# Patient Record
Sex: Female | Born: 1960
Health system: Southern US, Community
[De-identification: ages and names within clinical notes are randomized; demographics above are authoritative.]

## PROBLEM LIST (undated history)

## (undated) DIAGNOSIS — R05 Cough: Secondary | ICD-10-CM

## (undated) DIAGNOSIS — I10 Essential (primary) hypertension: Secondary | ICD-10-CM

## (undated) DIAGNOSIS — E7800 Pure hypercholesterolemia, unspecified: Secondary | ICD-10-CM

## (undated) DIAGNOSIS — M7989 Other specified soft tissue disorders: Secondary | ICD-10-CM

## (undated) DIAGNOSIS — E039 Hypothyroidism, unspecified: Secondary | ICD-10-CM

## (undated) DIAGNOSIS — Z8709 Personal history of other diseases of the respiratory system: Secondary | ICD-10-CM

## (undated) DIAGNOSIS — E079 Disorder of thyroid, unspecified: Secondary | ICD-10-CM

## (undated) DIAGNOSIS — R059 Cough, unspecified: Secondary | ICD-10-CM

## (undated) DIAGNOSIS — M7060 Trochanteric bursitis, unspecified hip: Secondary | ICD-10-CM

## (undated) DIAGNOSIS — I8390 Asymptomatic varicose veins of unspecified lower extremity: Secondary | ICD-10-CM

## (undated) DIAGNOSIS — J302 Other seasonal allergic rhinitis: Secondary | ICD-10-CM

## (undated) HISTORY — PX: TONSILLECTOMY: SUR1361

## (undated) HISTORY — PX: KNEE SURGERY: SHX244

## (undated) HISTORY — DX: Trochanteric bursitis, unspecified hip: M70.60

## (undated) HISTORY — DX: Asymptomatic varicose veins of unspecified lower extremity: I83.90

## (undated) HISTORY — DX: Other specified soft tissue disorders: M79.89

---

## 1998-11-07 ENCOUNTER — Other Ambulatory Visit: Admission: RE | Admit: 1998-11-07 | Discharge: 1998-11-07 | Payer: Self-pay | Admitting: Obstetrics and Gynecology

## 2000-01-30 ENCOUNTER — Other Ambulatory Visit: Admission: RE | Admit: 2000-01-30 | Discharge: 2000-01-30 | Payer: Self-pay | Admitting: *Deleted

## 2000-11-27 ENCOUNTER — Encounter: Admission: RE | Admit: 2000-11-27 | Discharge: 2000-12-10 | Payer: Self-pay | Admitting: Sports Medicine

## 2000-12-11 ENCOUNTER — Encounter: Admission: RE | Admit: 2000-12-11 | Discharge: 2001-01-01 | Payer: Self-pay | Admitting: Sports Medicine

## 2000-12-18 ENCOUNTER — Emergency Department (HOSPITAL_COMMUNITY): Admission: EM | Admit: 2000-12-18 | Discharge: 2000-12-18 | Payer: Self-pay | Admitting: Emergency Medicine

## 2001-03-11 ENCOUNTER — Other Ambulatory Visit: Admission: RE | Admit: 2001-03-11 | Discharge: 2001-03-11 | Payer: Self-pay | Admitting: Obstetrics and Gynecology

## 2002-04-28 ENCOUNTER — Emergency Department (HOSPITAL_COMMUNITY): Admission: EM | Admit: 2002-04-28 | Discharge: 2002-04-28 | Payer: Self-pay | Admitting: Emergency Medicine

## 2002-05-17 ENCOUNTER — Other Ambulatory Visit: Admission: RE | Admit: 2002-05-17 | Discharge: 2002-05-17 | Payer: Self-pay | Admitting: Obstetrics and Gynecology

## 2003-06-09 ENCOUNTER — Other Ambulatory Visit: Admission: RE | Admit: 2003-06-09 | Discharge: 2003-06-09 | Payer: Self-pay | Admitting: Obstetrics and Gynecology

## 2004-06-12 ENCOUNTER — Ambulatory Visit: Payer: Self-pay | Admitting: Internal Medicine

## 2004-06-21 ENCOUNTER — Ambulatory Visit: Payer: Self-pay | Admitting: Internal Medicine

## 2004-06-25 ENCOUNTER — Ambulatory Visit (HOSPITAL_COMMUNITY): Admission: RE | Admit: 2004-06-25 | Discharge: 2004-06-25 | Payer: Self-pay | Admitting: Obstetrics and Gynecology

## 2004-06-29 ENCOUNTER — Ambulatory Visit: Payer: Self-pay | Admitting: Internal Medicine

## 2005-02-13 ENCOUNTER — Ambulatory Visit: Payer: Self-pay | Admitting: Internal Medicine

## 2005-02-26 ENCOUNTER — Ambulatory Visit (HOSPITAL_COMMUNITY): Admission: RE | Admit: 2005-02-26 | Discharge: 2005-02-26 | Payer: Self-pay | Admitting: Internal Medicine

## 2005-04-16 ENCOUNTER — Ambulatory Visit: Payer: Self-pay | Admitting: Pulmonary Disease

## 2006-09-09 ENCOUNTER — Ambulatory Visit (HOSPITAL_COMMUNITY): Admission: RE | Admit: 2006-09-09 | Discharge: 2006-09-09 | Payer: Self-pay | Admitting: Obstetrics and Gynecology

## 2008-02-10 ENCOUNTER — Ambulatory Visit: Payer: Self-pay | Admitting: Internal Medicine

## 2008-02-10 DIAGNOSIS — J329 Chronic sinusitis, unspecified: Secondary | ICD-10-CM | POA: Insufficient documentation

## 2008-02-10 DIAGNOSIS — T7840XA Allergy, unspecified, initial encounter: Secondary | ICD-10-CM | POA: Insufficient documentation

## 2008-02-10 DIAGNOSIS — G44219 Episodic tension-type headache, not intractable: Secondary | ICD-10-CM

## 2008-02-10 DIAGNOSIS — J310 Chronic rhinitis: Secondary | ICD-10-CM | POA: Insufficient documentation

## 2008-03-07 ENCOUNTER — Telehealth (INDEPENDENT_AMBULATORY_CARE_PROVIDER_SITE_OTHER): Payer: Self-pay | Admitting: *Deleted

## 2008-03-08 ENCOUNTER — Ambulatory Visit: Payer: Self-pay | Admitting: Internal Medicine

## 2008-07-30 ENCOUNTER — Emergency Department (HOSPITAL_BASED_OUTPATIENT_CLINIC_OR_DEPARTMENT_OTHER): Admission: EM | Admit: 2008-07-30 | Discharge: 2008-07-30 | Payer: Self-pay | Admitting: Emergency Medicine

## 2008-12-06 ENCOUNTER — Emergency Department (HOSPITAL_COMMUNITY): Admission: EM | Admit: 2008-12-06 | Discharge: 2008-12-06 | Payer: Self-pay | Admitting: Family Medicine

## 2009-04-19 ENCOUNTER — Telehealth: Payer: Self-pay | Admitting: Internal Medicine

## 2009-05-11 ENCOUNTER — Ambulatory Visit: Payer: Self-pay | Admitting: Internal Medicine

## 2009-05-17 ENCOUNTER — Telehealth (INDEPENDENT_AMBULATORY_CARE_PROVIDER_SITE_OTHER): Payer: Self-pay | Admitting: *Deleted

## 2010-06-07 ENCOUNTER — Encounter
Admission: RE | Admit: 2010-06-07 | Discharge: 2010-06-07 | Payer: Self-pay | Source: Home / Self Care | Attending: Obstetrics and Gynecology | Admitting: Obstetrics and Gynecology

## 2010-06-12 NOTE — Progress Notes (Signed)
Summary: Rhinocort RX  Phone Note Call from Patient Call back at [phone]   Caller: Patient Call For: wert Reason for Call: Talk to Nurse Summary of Call: pt told MW she wanted him to send her RX's to [facility].  He sent to [facility] on [address].  Can we c/x [facility] and re-send to [facility],? Initial call taken by: Eugene Gavia,  May 17, 2009 4:31 PM  Follow-up for Phone Call        [facility] to let pt know RX for both medications have been sent ot [facility] and [facility] was called and told to void RX's sent to them. Follow-up by: Michel Bickers CMA,  May 17, 2009 4:58 PM    Prescriptions: RHINOCORT AQUA 32 MCG/ACT SUSP (BUDESONIDE) 2 twice daily to taper to 1 at bedtime  #1 x 11   Entered by:   Michel Bickers CMA   Authorized by:   Nyoka Cowden MD   Signed by:   Michel Bickers CMA on 05/17/2009   Method used:   Electronically to        [facility]* (retail)       [address]       [address] Shipping/mailing       [place], [place]  [address]       Ph: [phone]       Fax: [phone]   RxID:   [id number] ESGIC-PLUS 50-500-40 MG CAPS [facility]) 1 every 6 hours if needed for headache  #40 x 0   Entered by:   Michel Bickers CMA   Authorized by:   Nyoka Cowden MD   Signed by:   Michel Bickers CMA on 05/17/2009   Method used:   Electronically to        [facility]* (retail)       [address]       [address] Shipping/mailing       [place], [place]  [address]       Ph: [phone]       Fax: [phone]   RxID:   [id number]

## 2010-08-08 ENCOUNTER — Other Ambulatory Visit: Payer: Self-pay | Admitting: Internal Medicine

## 2010-08-23 LAB — RAPID STREP SCREEN (MED CTR MEBANE ONLY): Streptococcus, Group A Screen (Direct): NEGATIVE

## 2010-08-30 ENCOUNTER — Encounter: Payer: Self-pay | Admitting: Family Medicine

## 2010-08-30 ENCOUNTER — Ambulatory Visit (INDEPENDENT_AMBULATORY_CARE_PROVIDER_SITE_OTHER): Payer: 59 | Admitting: Family Medicine

## 2010-08-30 VITALS — Ht 69.0 in | Wt 155.6 lb

## 2010-08-30 DIAGNOSIS — E785 Hyperlipidemia, unspecified: Secondary | ICD-10-CM

## 2010-08-30 NOTE — Patient Instructions (Addendum)
-   [place]phy Lecture:  Apr 25 at 8:30, Rooms 29, 30, & 31:  Hyper-palatable foods.  - Please let me know your most recent HDL, LDL, triglycerides, and total cholesterol.   - [employer] Lafonda Mosses, researcher at [employer].   - Eat at least 3 meals and 1-2 snacks daily.  Try to never go more than 5 hours between eating.   - Taste preferences are learned:  Preference follows practice.   - Make a list of (non-starchy) veg's and fruits you do like and eat; and a list of F & V you won't touch; and a list of F & V that you will consider if doctored up.  Work from the [employer]:  Try one F or V 3 X wk.  Use small amounts, cut small, and with seasoning.   - Goal:  Obtain at least 2 fruit or veg servings per day.  Track your intake daily.   - Physical activity:  There are 168 hours in a week.   Does it sound like enough to devote 2 1/2 hours a week to moving?   - Goal:  20 min TM walking 4 X wk (& continue 15 min floor exercises daily).   - AT YOUR NEXT VISIT, WE WILL DISCUSS MORE SPECIFIC DIETARY CHANGES FOR YOUR LIPID PROFILE.

## 2010-08-30 NOTE — Progress Notes (Signed)
Medical Nutrition Therapy:  Appt start time: 0900 end time:  1000.  Assessment:  Primary concerns today: hyperlipidemia.  Usual eating pattern includes bkfst and lunch meals and 2 snacks per day.  Everyday foods include flavored instant oatmeal, 20 oz diet Mtn Dew, and 1 c coffee w/ Splenda and 2-3 tbsp flavored creamer.  Avoided foods include alcohol, most veg's.  24-hr recall: B (7:30 AM)- 1 packet instant oatmeal, coffee w/ Splenda and 2-3 tbsp flavored creamer; L (12:30 PM)- [place] sandwich on thin bread, laughing cow cheese wedge, 3 baby carrots, diet Mtn Dew, piece of cake; Snk (3:30 PM)- diet Mtn Dew, fun size 3 Musketeers; D- none; Snk (8 PM)- 3 Snackwell cookies, water.   Usual physical activity includes 15 min floor exercises 7 X wk, and she parks far from work (~5 min) 4 X wk.  Anna has had hyperlipidemia for all of her adult life.  Tatia eats something sweet at least 2 X day.  She said she is aware that her diet is not very nutritious, but her taste preferences represent a major obstacle to change.    Progress Towards Goal(s):  In progress.   Nutritional Diagnosis:  NB-2.1 Physical inactivity As related to inadequate motivation.  As evidenced by current exercise limited to 15 min of floor exercises daily. . NI-5.8.3 Inappropriate intake of types of carbohydrates (specify): refined CHOs As related to sweets, fruit, and veg's.  As evidenced by rare consumption of fruit and veg's and frequent intake of sweets daily.    Intervention:  Nutrition education.  Monitoring/Evaluation:  Dietary intake, exercise, and body weight in 4 weeks.

## 2010-09-17 ENCOUNTER — Ambulatory Visit: Payer: 59 | Admitting: Family Medicine

## 2010-09-17 NOTE — Patient Instructions (Addendum)
-   Reminder:  Eat at least 3 meals and 1-2 snacks per day.  Aim for no more than 5 hours between eating.  This way of eating helps to control cholesterol levels, blood sugar, and weight.   - Continue to increase veg's, this time working from your "OK" list.  Use ideas from "Deceptively Delicious" to add veg's to recipes, and work toward increasing the amount of veg's used so they are detectable.   - Goals for exercise this week:    1. 20 min 4 X wk (plus floor exercises at least 6 X wk).    2. Each work day,l take stairs at least once a day to third floor.   - Again:  Look for one of [employer] to hear.   - Lipids management:  Review handout provided on heart-healthy diet.

## 2010-09-17 NOTE — Progress Notes (Signed)
Medical Nutrition Therapy:  Appt start time: 0900 end time:  1000.  Assessment:  Primary concerns today: hyperlipidemia.  Daelyn has not been able to increase exercise mostly b/c she has been out of town for 2 1/2 weeks.   During that time, she was eating out for most meals.  Since home, she has not gotten back into her usual exercise routine, but she has been eating more veg's.  Although Marva made a list of veg's she likes, dislikes, and might be willing to try, she has not yet incorporated any of the latter to her diet.  Amberlie has cut down on her Splenda and creamer in her coffee (3 instead of 4 tsp of each).  Trenesha continues to eat something sweet on most days, although portion size is small, i.e., candies.    Progress Towards Goal(s):  In progress.   Nutritional Diagnosis:  NB-2.1 Physical inactivity As related to inadequate motivation.  As evidenced by current exercise limited to 15 min of floor exercises a few times a week. NI-5.8.3 Inappropriate intake of types of carbohydrates (specify): refined CHOs As related to sweets, fruit, and vegetables.  As evidenced by rare consumption of fruit and veg&amp;#39;s and intake of sweets daily.    Intervention:  Nutrition education.  Monitoring/Evaluation:  Dietary intake, exercise, and body weight in 4 weeks.

## 2010-10-02 ENCOUNTER — Encounter: Payer: Self-pay | Admitting: Family Medicine

## 2010-10-15 ENCOUNTER — Ambulatory Visit (INDEPENDENT_AMBULATORY_CARE_PROVIDER_SITE_OTHER): Payer: 59 | Admitting: Family Medicine

## 2010-10-15 DIAGNOSIS — E785 Hyperlipidemia, unspecified: Secondary | ICD-10-CM

## 2010-10-15 NOTE — Progress Notes (Signed)
Medical Nutrition Therapy:  Appt start time: 0900 end time:  1000.  Assessment:  Primary concerns today: hyperlipidemia.  Among her new foods she has tried are lima beans, blackeyed peas, and butter beans.  She is still eating carrots frequently, but has not tried any non-starchy veg's.  She has started eating apples some, and is considering cantaloupe and possibly broccoli.  24-hr recall: B (8 AM)- Quaker cinn-swirl high-fiber instant oatmeal, 1 c coffee w/ 3 tsp Carnation Jamaica Vanilla creamer and 3 Splenda; L (1 PM)- 2 Kellogg's cereal bars, 6 oz Mtn Dew; Snk (2 PM)- 2 pb crackers w/ Captain's wafers; D (5 PM)- small piece Marie Calender's lasagna, garlic bread, water.  Yesterday was atypical in that she did not have a real lunch.  Lesa is taking the stairs at work regularly, but is only walking 2-3 X wk instead of 4.  She still does her floor exercises daily, and has added small weights to her routine.    Progress Towards Goal(s):  In progress.   Nutritional Diagnosis:  NB-2.1 Physical inactivity As related to inadequate motivation.  As evidenced by current exercise limited to nearly daily floor exercises and ~20 min walking 2-3 X wk. NI-5.8.2 Excessive carbohydrate intake As related to sweets, fruit, and vegetables.  As evidenced by rare consumption of veg's (although fruit intake has increased to ~daily.    Intervention:  Nutrition education.  Monitoring/Evaluation:  Dietary intake, exercise, and body weight in 4 weeks.

## 2010-10-15 NOTE — Patient Instructions (Addendum)
-   Continue to try some new foods, especially VEGETABLES.  For convenience, use your microwave for veg's.  Add no water, but do cover loosely, and microwave on high (on average for 2 min per serving).  - In trying to incorporate new veg's, use small amounts, cut into small pieces.    - I encourage you to go back to trying to cut down on the amount of cream and sweetener in your coffee, going down to 2 1/2 of each rather than jumping to 2.  Or consider skim milk instead of creamer (and use all you want).   - Obtain twice as many veg's as protein or carbohydrate foods for both lunch and dinner. - Make a list of at least 7 meals that you like, that meet your nutritional needs, AND are relatively easy to prepare.  Keep this list in a place where it's handy.  Email this list to [facility] for review.   - Please call or email for next month's appt.

## 2011-06-13 ENCOUNTER — Other Ambulatory Visit: Payer: Self-pay | Admitting: Obstetrics and Gynecology

## 2011-06-13 DIAGNOSIS — Z1231 Encounter for screening mammogram for malignant neoplasm of breast: Secondary | ICD-10-CM

## 2011-06-24 ENCOUNTER — Ambulatory Visit
Admission: RE | Admit: 2011-06-24 | Discharge: 2011-06-24 | Disposition: A | Discharge status: Home / Self Care | Payer: 59 | Source: Ambulatory Visit | Attending: Obstetrics and Gynecology | Admitting: Obstetrics and Gynecology

## 2011-06-24 DIAGNOSIS — Z1231 Encounter for screening mammogram for malignant neoplasm of breast: Secondary | ICD-10-CM

## 2012-05-26 ENCOUNTER — Ambulatory Visit: Payer: 59 | Admitting: Physical Therapy

## 2012-05-28 ENCOUNTER — Other Ambulatory Visit (HOSPITAL_COMMUNITY): Payer: Self-pay | Admitting: Sports Medicine

## 2012-05-28 DIAGNOSIS — M25512 Pain in left shoulder: Secondary | ICD-10-CM

## 2012-05-29 ENCOUNTER — Ambulatory Visit (HOSPITAL_COMMUNITY)
Admission: RE | Admit: 2012-05-29 | Discharge: 2012-05-29 | Disposition: A | Discharge status: Home / Self Care | Payer: 59 | Source: Ambulatory Visit | Attending: Sports Medicine | Admitting: Sports Medicine

## 2012-05-29 DIAGNOSIS — M67919 Unspecified disorder of synovium and tendon, unspecified shoulder: Secondary | ICD-10-CM | POA: Insufficient documentation

## 2012-05-29 DIAGNOSIS — M25512 Pain in left shoulder: Secondary | ICD-10-CM

## 2012-05-29 DIAGNOSIS — M719 Bursopathy, unspecified: Secondary | ICD-10-CM | POA: Insufficient documentation

## 2012-06-27 ENCOUNTER — Other Ambulatory Visit: Payer: Self-pay

## 2013-03-18 ENCOUNTER — Other Ambulatory Visit: Payer: Self-pay

## 2013-05-03 ENCOUNTER — Emergency Department (HOSPITAL_COMMUNITY)
Admission: EM | Admit: 2013-05-03 | Discharge: 2013-05-03 | Disposition: A | Discharge status: Home / Self Care | Payer: 59 | Source: Home / Self Care | Attending: Emergency Medicine | Admitting: Emergency Medicine

## 2013-05-03 ENCOUNTER — Encounter (HOSPITAL_COMMUNITY): Payer: Self-pay | Admitting: Emergency Medicine

## 2013-05-03 DIAGNOSIS — R05 Cough: Secondary | ICD-10-CM

## 2013-05-03 HISTORY — DX: Disorder of thyroid, unspecified: E07.9

## 2013-05-03 HISTORY — DX: Essential (primary) hypertension: I10

## 2013-05-03 HISTORY — DX: Pure hypercholesterolemia, unspecified: E78.00

## 2013-05-03 MED ORDER — HYDROCOD POLST-CHLORPHEN POLST 10-8 MG/5ML PO LQCR: 5.0000 mL | Freq: Two times a day (BID) | ORAL | Status: DC | PRN

## 2013-05-03 MED ORDER — AZITHROMYCIN 250 MG PO TABS: ORAL_TABLET | ORAL | Status: DC

## 2013-05-03 NOTE — ED Provider Notes (Signed)
Medical screening examination/treatment/procedure(s) were performed by non-physician practitioner and as supervising physician I was immediately available for consultation/collaboration.  Leslee Home, M.D.   Reuben Likes, MD 05/03/13 2229

## 2013-05-03 NOTE — ED Provider Notes (Signed)
CSN: [id number]     Arrival date & time 05/03/13  0801 History   First MD Initiated Contact with Patient 05/03/13 0827     Chief Complaint  Patient presents with  . Cough   (Consider location/radiation/quality/duration/timing/severity/associated sxs/prior Treatment) HPI Comments: 52 year old female presents complaining of cough for 6 weeks, now developing some nasal congestion, sneezing, rhinorrhea. His cough initially began at the beginning of November. She was treated with Xanax which did help. However, the cough returned when the medication was finished. She was treated again with Levocetirizine, Tessalon Perles, and Delsym. This does not seem to be helping with the cough. The nasal congestion, sneezing, and rhinorrhea began 3 days ago. She is not having any shortness of breath, chest pain, pleuritic pain, leg swelling. No history of DVT or PE. She had a chest x-ray on December 10 which was normal. She also admits to a sensation of stuffiness or fullness in the ears bilaterally. She does not take an ACE inhibitor. No recent history of travel out of the country in the last 6 months. She does not smoke and has never smoked. No heartburn  Patient is a 52 y.o. female presenting with cough.  Cough Associated symptoms: ear pain, rhinorrhea and sore throat   Associated symptoms: no chest pain, no chills, no fever, no myalgias, no rash and no shortness of breath     Past Medical History  Diagnosis Date  . Hypertension   . Thyroid disease   . High cholesterol    History reviewed. No pertinent past surgical history. No family history on file. History  Substance Use Topics  . Smoking status: Passive Smoke Exposure - Never Smoker  . Smokeless tobacco: Not on file  . Alcohol Use: No   OB History   Grav Para Term Preterm Abortions TAB SAB Ect Mult Living                 Review of Systems  Constitutional: Negative for fever and chills.  HENT: Positive for congestion, ear pain, rhinorrhea,  sneezing and sore throat.   Eyes: Negative for visual disturbance.  Respiratory: Positive for cough. Negative for shortness of breath.   Cardiovascular: Negative for chest pain, palpitations and leg swelling.  Gastrointestinal: Negative for nausea, vomiting and abdominal pain.  Endocrine: Negative for polydipsia and polyuria.  Genitourinary: Negative for dysuria, urgency and frequency.  Musculoskeletal: Negative for arthralgias and myalgias.  Skin: Negative for rash.  Neurological: Negative for dizziness, weakness and light-headedness.    Allergies  Review of patient's allergies indicates no known allergies.  Home Medications   Current Outpatient Rx  Name  Route  Sig  Dispense  Refill  . BENZONATATE PO   Oral   Take by mouth.         . dextromethorphan (DELSYM) 30 MG/5ML liquid   Oral   Take by mouth as needed for cough.         . Hydrocod Polst-Chlorphen Polst (TUSSIONEX PENNKINETIC ER PO)   Oral   Take by mouth.         Marland Kitchen azithromycin (ZITHROMAX Z-PAK) 250 MG tablet      Use as directed   6 each   1   . chlorpheniramine-HYDROcodone (TUSSIONEX PENNKINETIC ER) 10-8 MG/5ML LQCR   Oral   Take 5 mLs by mouth every 12 (twelve) hours as needed for cough.   115 mL   0   . Coenzyme Q10 (COQ10) 200 MG CAPS   Oral   Take 200 mg  by mouth 1 dose over 46 hours.           . hydrochlorothiazide (,MICROZIDE/HYDRODIURIL,) 12.5 MG capsule   Oral   Take 12.5 mg by mouth daily.           Marland Kitchen levothyroxine (LEVOXYL) 75 MCG tablet   Oral   Take 75 mcg by mouth daily.           . Multiple Vitamins-Minerals (MULTIVITAMIN WITH MINERALS) tablet   Oral   Take 1 tablet by mouth daily.           . norethindrone-ethinyl estradiol-iron (MICROGESTIN FE1.5/30) 1.5-30 MG-MCG tablet   Oral   Take 1 tablet by mouth daily.           . rosuvastatin (CRESTOR) 20 MG tablet   Oral   Take 20 mg by mouth daily.            BP 154/89  Pulse 84  Temp(Src) 98.6 F (37 C)  (Oral)  Resp 16  SpO2 100% Physical Exam  Nursing note and vitals reviewed. Constitutional: She is oriented to person, place, and time. Vital signs are normal. She appears well-developed and well-nourished. No distress.  HENT:  Head: Normocephalic and atraumatic.  Right Ear: External ear normal.  Left Ear: External ear normal.  Nose: Nose normal.  Mouth/Throat: Oropharynx is clear and moist. No oropharyngeal exudate.  Neck: Normal range of motion. Neck supple. No JVD present. No tracheal deviation present.  Cardiovascular: Normal rate, regular rhythm and normal heart sounds.  Exam reveals no gallop and no friction rub.   No murmur heard. Pulmonary/Chest: Effort normal and breath sounds normal. No respiratory distress. She has no wheezes. She has no rales.  Lymphadenopathy:    She has no cervical adenopathy.  Neurological: She is alert and oriented to person, place, and time. She has normal strength. Coordination normal.  Skin: Skin is warm and dry. No rash noted. She is not diaphoretic.  Psychiatric: She has a normal mood and affect. Judgment normal.    ED Course  Procedures (including critical care time) Labs Review Labs Reviewed - No data to display Imaging Review No results found.    MDM   1. Cough     I suspect atypical infection such as mycoplasma or pertussis. Treating with azithromycin, also Tussionex for cough. Follow up with primary care if not improving. Discussed other causes of cough, suggest CT scan if cough persists   New Prescriptions   AZITHROMYCIN (ZITHROMAX Z-PAK) 250 MG TABLET    Use as directed   CHLORPHENIRAMINE-HYDROCODONE (TUSSIONEX PENNKINETIC ER) 10-8 MG/5ML LQCR    Take 5 mLs by mouth every 12 (twelve) hours as needed for cough.     Graylon Good, PA-C 05/03/13 [phone]

## 2013-05-03 NOTE — ED Notes (Signed)
Cough that started approx 6 weeks ago.  Cough improved, and since Thanksgiving and traveling, cough has worsened.  Over the weekend developed allergy symptoms: runny nose, sneeze, sore throat, stuffy ears.

## 2014-01-25 ENCOUNTER — Other Ambulatory Visit: Payer: Self-pay

## 2014-01-25 DIAGNOSIS — Z1231 Encounter for screening mammogram for malignant neoplasm of breast: Secondary | ICD-10-CM

## 2014-02-17 ENCOUNTER — Ambulatory Visit: Payer: 59

## 2014-03-15 ENCOUNTER — Ambulatory Visit: Payer: 59

## 2014-03-30 ENCOUNTER — Ambulatory Visit
Admission: RE | Admit: 2014-03-30 | Discharge: 2014-03-30 | Disposition: A | Discharge status: Home / Self Care | Payer: 59 | Source: Ambulatory Visit

## 2014-03-30 DIAGNOSIS — Z1231 Encounter for screening mammogram for malignant neoplasm of breast: Secondary | ICD-10-CM

## 2015-01-23 ENCOUNTER — Encounter: Payer: Self-pay | Admitting: Family Medicine

## 2015-01-23 ENCOUNTER — Ambulatory Visit (INDEPENDENT_AMBULATORY_CARE_PROVIDER_SITE_OTHER): Payer: 59 | Admitting: Family Medicine

## 2015-01-23 VITALS — BP 124/72 | HR 71 | Ht 68.0 in | Wt 142.0 lb

## 2015-01-23 DIAGNOSIS — M542 Cervicalgia: Secondary | ICD-10-CM

## 2015-01-23 MED ORDER — CYCLOBENZAPRINE HCL 5 MG PO TABS: ORAL_TABLET | ORAL | Status: DC

## 2015-01-23 NOTE — Progress Notes (Signed)
Patient ID: Ashley Burke, female   DOB: 03-10-1961, 54 y.o.   MRN: [id number]  Ashley Burke - 54 y.o. female MRN [id number]  Date of birth: February 04, 1961    SUBJECTIVE:     Left-sided neck pain for little over a week. Acute onset. Awoke to find her neck stiff extending down the left upper and mid back. Was seen orthopedist office where they did x-rays which were reportedly negative. They recommended massage therapy. She has continued to improve but still having symptoms. Overall she's about 30-40% better.  Pain and stiffness mostly left and posterior part of her neck into the tuft upper shoulder and mid back. No headache. ROS:     She's had no unusual weight change, no fever, sweats, chills. No unusual numbness or tingling, no upper extremity weakness.   PERTINENT  PMH / PSH FH / / SH:  Past Medical, Surgical, Social, and Family History Reviewed & Updated in the EMR.  Pertinent findings include:  Can think of no specific identifiable injury or activity that might have predisposed her for this. She has no personal history of diabetes mellitus or cancer, no history of osteoporosis. No history of pathologic fracture.  OBJECTIVE: BP 124/72 mmHg  Pulse 71  Ht 5\' 8"  (1.727 m)  Wt 142 lb (64.411 kg)  BMI 21.60 kg/m2  LMP 05/22/2012  Physical Exam:  Vital signs are reviewed. GEN.: Well-developed female no acute distress NECK: She has full range of motion but expresses a lot of pain in pulling sensation with forward flexion and some with extension. Lateral rotation to the left is limited by pain but full in range of motion, lateral rotation to the right side is essentially full. Very mild tenderness to palpation along the left splenius capitis and uppermost left trapezius muscles. Cervical vertebra are nontender to percussion. Negative Spurling's. MSK: Upper extremity strength is 5 out of 5 in all planes the rotator cuff. She has intact sensation soft touch upper extremity.  ASSESSMENT &  PLAN:  See problem based charting & AVS for pt instructions.

## 2015-01-23 NOTE — Patient Instructions (Signed)
[email or web address]  I will call in some mild muscle relaxers Great to see you!

## 2015-01-23 NOTE — Assessment & Plan Note (Signed)
Gave her some overhead press exercise instructions and some neck stretches. We will do a short course of low-dose muscle relaxers at night. She is improving at this time. If she does not continue to improve and ultimately resolve or her symptoms returned, would want her to give [place] a call. At that point I would want to either see the images done by the orthopedist office or get some new imaging studies. We discussed red flags such as weakness or numbness in the extremity.

## 2015-03-31 ENCOUNTER — Other Ambulatory Visit (HOSPITAL_BASED_OUTPATIENT_CLINIC_OR_DEPARTMENT_OTHER): Payer: 59

## 2015-03-31 ENCOUNTER — Other Ambulatory Visit (HOSPITAL_BASED_OUTPATIENT_CLINIC_OR_DEPARTMENT_OTHER): Payer: Self-pay | Admitting: Family Medicine

## 2015-03-31 ENCOUNTER — Other Ambulatory Visit (HOSPITAL_COMMUNITY): Payer: Self-pay | Admitting: Family Medicine

## 2015-03-31 DIAGNOSIS — M79662 Pain in left lower leg: Secondary | ICD-10-CM

## 2015-03-31 DIAGNOSIS — M7989 Other specified soft tissue disorders: Principal | ICD-10-CM

## 2015-03-31 DIAGNOSIS — R609 Edema, unspecified: Secondary | ICD-10-CM

## 2015-04-03 ENCOUNTER — Encounter (HOSPITAL_COMMUNITY): Payer: 59

## 2015-04-04 ENCOUNTER — Ambulatory Visit (HOSPITAL_COMMUNITY)
Admission: RE | Admit: 2015-04-04 | Discharge: 2015-04-04 | Disposition: A | Discharge status: Home / Self Care | Payer: 59 | Source: Ambulatory Visit | Attending: Family Medicine | Admitting: Family Medicine

## 2015-04-04 DIAGNOSIS — R609 Edema, unspecified: Secondary | ICD-10-CM

## 2015-04-10 ENCOUNTER — Ambulatory Visit (HOSPITAL_COMMUNITY)
Admission: RE | Admit: 2015-04-10 | Discharge: 2015-04-10 | Disposition: A | Discharge status: Home / Self Care | Payer: 59 | Source: Ambulatory Visit | Attending: Family Medicine | Admitting: Family Medicine

## 2015-04-10 DIAGNOSIS — M7989 Other specified soft tissue disorders: Secondary | ICD-10-CM | POA: Diagnosis present

## 2015-04-10 DIAGNOSIS — I8392 Asymptomatic varicose veins of left lower extremity: Secondary | ICD-10-CM | POA: Insufficient documentation

## 2015-04-10 DIAGNOSIS — R609 Edema, unspecified: Secondary | ICD-10-CM | POA: Diagnosis not present

## 2015-04-10 NOTE — Progress Notes (Signed)
VASCULAR LAB PRELIMINARY  PRELIMINARY  PRELIMINARY  PRELIMINARY  Left lower extremity venous duplex completed.    Preliminary report:  Left:  No evidence of DVT, superficial thrombosis, or Baker's cyst.  Warnie Belair, RVT 04/10/2015, 8:28 AM

## 2015-04-28 ENCOUNTER — Other Ambulatory Visit: Payer: Self-pay | Admitting: *Deleted

## 2015-04-28 ENCOUNTER — Encounter: Payer: Self-pay | Admitting: Vascular Surgery

## 2015-04-28 DIAGNOSIS — I83893 Varicose veins of bilateral lower extremities with other complications: Secondary | ICD-10-CM

## 2015-05-23 DIAGNOSIS — I8312 Varicose veins of left lower extremity with inflammation: Secondary | ICD-10-CM | POA: Diagnosis not present

## 2015-05-23 DIAGNOSIS — I8311 Varicose veins of right lower extremity with inflammation: Secondary | ICD-10-CM | POA: Diagnosis not present

## 2015-05-30 DIAGNOSIS — I8312 Varicose veins of left lower extremity with inflammation: Secondary | ICD-10-CM | POA: Diagnosis not present

## 2015-05-30 DIAGNOSIS — I83813 Varicose veins of bilateral lower extremities with pain: Secondary | ICD-10-CM | POA: Diagnosis not present

## 2015-05-30 DIAGNOSIS — I8311 Varicose veins of right lower extremity with inflammation: Secondary | ICD-10-CM | POA: Diagnosis not present

## 2015-06-05 MED FILL — YUVAFEM 10 MCG VAGINAL INSE: 10 | 30 days supply | Qty: 18 | Fill #1

## 2015-06-05 MED FILL — MELOXICAM 15 MG TABLET: 15 | 30 days supply | Qty: 30 | Fill #1

## 2015-06-05 MED FILL — ROSUVASTATIN CALCIUM 20 MG: 20 | 90 days supply | Qty: 90 | Fill #2

## 2015-06-05 MED FILL — LEVOTHYROXINE 50 MCG TABLET: 50 | 90 days supply | Qty: 90 | Fill #0

## 2015-06-06 MED FILL — FLUTICASONE PROP 50 MCG SPR: 50 | 30 days supply | Qty: 16 | Fill #0

## 2015-06-14 ENCOUNTER — Encounter: Payer: Self-pay | Admitting: Vascular Surgery

## 2015-06-14 DIAGNOSIS — E039 Hypothyroidism, unspecified: Secondary | ICD-10-CM | POA: Diagnosis not present

## 2015-06-14 DIAGNOSIS — E782 Mixed hyperlipidemia: Secondary | ICD-10-CM | POA: Diagnosis not present

## 2015-06-20 ENCOUNTER — Ambulatory Visit (INDEPENDENT_AMBULATORY_CARE_PROVIDER_SITE_OTHER): Payer: 59 | Admitting: Vascular Surgery

## 2015-06-20 ENCOUNTER — Encounter: Payer: Self-pay | Admitting: Vascular Surgery

## 2015-06-20 ENCOUNTER — Ambulatory Visit (HOSPITAL_COMMUNITY)
Admission: RE | Admit: 2015-06-20 | Discharge: 2015-06-20 | Disposition: A | Discharge status: Home / Self Care | Payer: 59 | Source: Ambulatory Visit | Attending: Vascular Surgery | Admitting: Vascular Surgery

## 2015-06-20 VITALS — BP 132/85 | HR 76 | Temp 98.5°F | Resp 14 | Ht 69.0 in | Wt 148.0 lb

## 2015-06-20 DIAGNOSIS — I83892 Varicose veins of left lower extremities with other complications: Secondary | ICD-10-CM | POA: Diagnosis not present

## 2015-06-20 DIAGNOSIS — I83893 Varicose veins of bilateral lower extremities with other complications: Secondary | ICD-10-CM

## 2015-06-20 DIAGNOSIS — I10 Essential (primary) hypertension: Secondary | ICD-10-CM | POA: Insufficient documentation

## 2015-06-20 DIAGNOSIS — E7800 Pure hypercholesterolemia, unspecified: Secondary | ICD-10-CM | POA: Diagnosis not present

## 2015-06-20 NOTE — Progress Notes (Signed)
Subjective:     Patient ID: Ashley Burke, female   DOB: 1960-10-20, 55 y.o.   MRN: [id number]  HPI This 55 year old female was evaluated today for painful varicosities in the left leg. She began having swelling in the left lower leg in November 2016. She has no history of DVT thrombophlebitis stasis ulcers or bleeding. She noticed prominent bulges in her left posterior calf which have worsened over the past few months. She has aching burning and throbbing discomfort as the day progresses. She was seen at [place] [facility] in late December and it was recommended that she have laser ablation of her left saphenous vein. She has been wearing long leg elastic compression stockings 20-30 mm gradient since late December 2016. She has also tried pain medication and elevation which she is unable to do at work.   Past Medical History  Diagnosis Date  . Hypertension   . Thyroid disease   . High cholesterol   . Varicose veins   . Leg swelling   . Trochanteric bursitis     Social History  Substance Use Topics  . Smoking status: Never Smoker   . Smokeless tobacco: Not on file  . Alcohol Use: No    Family History  Problem Relation Age of Onset  . Cancer Mother     liver  . Cancer Father     lung  . Heart disease Father     Allergies  Allergen Reactions  . Amlodipine Besylate Other (See Comments)    dizziness     Current outpatient prescriptions:  .  budesonide (RHINOCORT AQUA) 32 MCG/ACT nasal spray, Place 1 spray into both nostrils as needed for rhinitis., Disp: , Rfl:  .  Coenzyme Q10 (COQ10) 200 MG CAPS, Take 200 mg by mouth 1 dose over 46 hours.  , Disp: , Rfl:  .  hydrochlorothiazide (,MICROZIDE/HYDRODIURIL,) 12.5 MG capsule, Take 12.5 mg by mouth daily.  , Disp: , Rfl:  .  Multiple Vitamins-Minerals (MULTIVITAMIN WITH MINERALS) tablet, Take 1 tablet by mouth daily.  , Disp: , Rfl:  .  norethindrone-ethinyl estradiol-iron (MICROGESTIN FE1.5/30) 1.5-30 MG-MCG tablet, Take 1 tablet by  mouth daily.  , Disp: , Rfl:  .  rosuvastatin (CRESTOR) 20 MG tablet, Take 20 mg by mouth daily.  , Disp: , Rfl:  .  vitamin B-12 (CYANOCOBALAMIN) 1000 MCG tablet, Take 1,000 mcg by mouth daily., Disp: , Rfl:  .  azithromycin (ZITHROMAX Z-PAK) 250 MG tablet, Use as directed (Patient not taking: Reported on 01/23/2015), Disp: 6 each, Rfl: 1 .  BENZONATATE PO, Take by mouth. Reported on 06/20/2015, Disp: , Rfl:  .  chlorpheniramine-HYDROcodone (TUSSIONEX PENNKINETIC ER) 10-8 MG/5ML LQCR, Take 5 mLs by mouth every 12 (twelve) hours as needed for cough. (Patient not taking: Reported on 01/23/2015), Disp: 115 mL, Rfl: 0 .  cyclobenzaprine (FLEXERIL) 5 MG tablet, Take one half or one tab at bedtime prn (Patient not taking: Reported on 06/20/2015), Disp: 30 tablet, Rfl: 0 .  dextromethorphan (DELSYM) 30 MG/5ML liquid, Take by mouth as needed for cough. Reported on 06/20/2015, Disp: , Rfl:  .  levothyroxine (LEVOXYL) 75 MCG tablet, Take 50 mcg by mouth daily. Reported on 06/20/2015, Disp: , Rfl:   Filed Vitals:   06/20/15 1013  BP: 132/85  Pulse: 76  Temp: 98.5 F (36.9 C)  Resp: 14  Height: 5\' 9"  (1.753 m)  Weight: 148 lb (67.132 kg)  SpO2: 98%    Body mass index is 21.85 kg/(m^2).  Review of Systems  Denies chest pain, dyspneaon exertion, PND, orthopnea, claudication. All systems negative for complete review of systems. Did have previous fracture of her left knee treated by Dr. Noemi Chapel     Objective:   Physical Exam BP 132/85 mmHg  Pulse 76  Temp(Src) 98.5 F (36.9 C)  Resp 14  Ht 5\' 9"  (1.753 m)  Wt 148 lb (67.132 kg)  BMI 21.85 kg/m2  SpO2 98%  LMP 05/22/2012  Gen.-alert and oriented x3 in no apparent distress HEENT normal for age Lungs no rhonchi or wheezing Cardiovascular regular rhythm no murmurs carotid pulses 3+ palpable no bruits audible Abdomen soft nontender no palpable masses Musculoskeletal free of  major deformities Skin clear -no rashes Neurologic  normal Lower extremities 3+ femoral and dorsalis pedis pulses palpable bilaterally with  1+ edema left ankle  Bulging varicosities in left posterior calf and reticular and spider veins around left malleolar area. No hyperpigmentation or ulceration noted.  Today I ordered a venous duplex exam which I reviewed and interpreted. There is no DVT. There is gross reflux throughout a large left great saphenous vein from the mid calf to near the saphenofemoral junction. Left small saphenous vein has reflux distally but it is small caliber.       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 This is causing symptoms which are affecting patient's daily living and ability to work including pain and swelling     Plan:         #1 long leg elastic compression stockings 20-30 mm gradient #2 elevate legs as much as possible #3 ibuprofen daily on a regular basis for pain #4 return in 3 months-if no significant improvement then  She will need number-one laser ablation left great saphenous vein plus 10-20 stab phlebectomy of painful varicosities followed by 1 session of sclerotherapy  She'll return  Late March which will be 3 months following her bginning the elastic compression stockings in late December

## 2015-07-03 MED FILL — VAGIFEM 10 MCG VAGINAL TAB: 10 | 28 days supply | Qty: 8 | Fill #2

## 2015-07-18 DIAGNOSIS — M7541 Impingement syndrome of right shoulder: Secondary | ICD-10-CM | POA: Diagnosis not present

## 2015-07-18 DIAGNOSIS — M542 Cervicalgia: Secondary | ICD-10-CM | POA: Diagnosis not present

## 2015-07-26 ENCOUNTER — Encounter: Payer: Self-pay | Admitting: Vascular Surgery

## 2015-07-31 ENCOUNTER — Ambulatory Visit (INDEPENDENT_AMBULATORY_CARE_PROVIDER_SITE_OTHER): Payer: 59 | Admitting: Vascular Surgery

## 2015-07-31 ENCOUNTER — Other Ambulatory Visit: Payer: Self-pay | Admitting: Family Medicine

## 2015-07-31 VITALS — BP 118/68 | HR 63 | Temp 97.7°F | Resp 14 | Ht 68.5 in | Wt 148.0 lb

## 2015-07-31 DIAGNOSIS — I83892 Varicose veins of left lower extremities with other complications: Secondary | ICD-10-CM | POA: Diagnosis not present

## 2015-07-31 MED FILL — MELOXICAM 15 MG TABLET: 15 | 30 days supply | Qty: 30 | Fill #2

## 2015-07-31 MED FILL — CYCLOBENZAPRINE 5 MG TABLET: 5 | 30 days supply | Qty: 30 | Fill #0

## 2015-07-31 NOTE — Progress Notes (Signed)
Subjective:     Patient ID: Ashley Burke, female   DOB: 02-19-1961, 55 y.o.   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 She has been wearing her long leg elastic compression stockings for 3 months 20-30 millimeter gradient since she was evaluated at [facility] in December 2016. She has had no improvement in her symptomatology. She continues to have aching throbbing burning discomfort in the left leg which worsens as the day progresses and is affecting her daily living.  Past Medical History  Diagnosis Date  . Hypertension   . Thyroid disease   . High cholesterol   . Varicose veins   . Leg swelling   . Trochanteric bursitis     Social History  Substance Use Topics  . Smoking status: Never Smoker   . Smokeless tobacco: Not on file  . Alcohol Use: No    Family History  Problem Relation Age of Onset  . Cancer Mother     liver  . Cancer Father     lung  . Heart disease Father     Allergies  Allergen Reactions  . Amlodipine Besylate Other (See Comments)    dizziness     Current outpatient prescriptions:  .  budesonide (RHINOCORT AQUA) 32 MCG/ACT nasal spray, Place 1 spray into both nostrils as needed for rhinitis., Disp: , Rfl:  .  Coenzyme Q10 (COQ10) 200 MG CAPS, Take 200 mg by mouth 1 dose over 46 hours.  , Disp: , Rfl:  .  levothyroxine (LEVOXYL) 75 MCG tablet, Take 50 mcg by mouth daily. Reported on 06/20/2015, Disp: , Rfl:  .  Multiple Vitamins-Minerals (MULTIVITAMIN WITH MINERALS) tablet, Take 1 tablet by mouth daily.  , Disp: , Rfl:  .  rosuvastatin (CRESTOR) 20 MG tablet, Take 20 mg by mouth daily.  , Disp: , Rfl:  .  vitamin B-12 (CYANOCOBALAMIN) 1000 MCG tablet, Take 1,000 mcg by mouth daily., Disp: , Rfl:  .  azithromycin (ZITHROMAX Z-PAK) 250 MG tablet, Use as directed (Patient not taking: Reported on 01/23/2015),  Disp: 6 each, Rfl: 1 .  BENZONATATE PO, Take by mouth. Reported on 07/31/2015, Disp: , Rfl:  .  chlorpheniramine-HYDROcodone (TUSSIONEX PENNKINETIC ER) 10-8 MG/5ML LQCR, Take 5 mLs by mouth every 12 (twelve) hours as needed for cough. (Patient not taking: Reported on 01/23/2015), Disp: 115 mL, Rfl: 0 .  cyclobenzaprine (FLEXERIL) 5 MG tablet, TAKE 1/2- 1 TABLET AT BEDTIME AS NEEDED (Patient not taking: Reported on 07/31/2015), Disp: 30 tablet, Rfl: 0 .  dextromethorphan (DELSYM) 30 MG/5ML liquid, Take by mouth as needed for cough. Reported on 07/31/2015, Disp: , Rfl:  .  hydrochlorothiazide (,MICROZIDE/HYDRODIURIL,) 12.5 MG capsule, Take 12.5 mg by mouth daily. Reported on 07/31/2015, Disp: , Rfl:  .  norethindrone-ethinyl estradiol-iron (MICROGESTIN FE1.5/30) 1.5-30 MG-MCG tablet, Take 1 tablet by mouth daily. Reported on 07/31/2015, Disp: , Rfl:   Filed Vitals:   07/31/15 1039  BP: 118/68  Pulse: 63  Temp: 97.7 F (36.5 C)  Resp: 14  Height: 5' 8.5" (1.74 m)  Weight: 148 lb (67.132 kg)  SpO2: 100%    Body mass index is 22.17 kg/(m^2).           Review of Systems denies chest pain, dyspnea on exertion, PND, orthopnea, hemoptysis     Objective:   Physical Exam BP 118/68 mmHg  Pulse 63  Temp(Src)  97.7 F (36.5 C)  Resp 14  Ht 5' 8.5" (1.74 m)  Wt 148 lb (67.132 kg)  BMI 22.17 kg/m2  SpO2 100%  LMP 05/22/2012  Gen. well-developed well-nourished female no apparent distress alert and oriented 3 Lungs no rhonchi or wheezing Left leg with bulging varicosities in the medial calf extending down toward the medial malleolus with spider veins around the medial malleolar area and 1+ edema. No hyperpigmentation or active ulceration noted.  Previous venous duplex exam in our office on February 26 revealed gross reflux throughout left great saphenous vein supplying these painful varicosities     Assessment:     Painful varicosities left leg with distal swelling due to gross reflux  left great saphenous vein. Patient's symptoms are resistant to conservative measures including long leg elastic compression stockings, elevation, and ibuprofen. She has had the procedure previously approved to be done at [facility] but patient decided to come to this facility and we are now submitting this for re-approval for our facility    Plan:     We'll plan to proceed with precertification to perform left leg laser ablation of great saphenous vein +10-20 stab phlebectomy of painful varicosities and 1 course of sclerotherapy We will submit this so that we can proceed in the near future to hopefully improve course relieve her symptoms

## 2015-07-31 NOTE — Telephone Encounter (Signed)
Patient seen in sports medicine by Dr. Nori Riis. Derl Barrow, RN

## 2015-08-15 DIAGNOSIS — S43491D Other sprain of right shoulder joint, subsequent encounter: Secondary | ICD-10-CM | POA: Diagnosis not present

## 2015-08-15 DIAGNOSIS — M25511 Pain in right shoulder: Secondary | ICD-10-CM | POA: Diagnosis not present

## 2015-08-18 DIAGNOSIS — E039 Hypothyroidism, unspecified: Secondary | ICD-10-CM | POA: Diagnosis not present

## 2015-08-21 MED FILL — FLUTICASONE PROP 50 MCG SPR: 50 | 30 days supply | Qty: 16 | Fill #1

## 2015-08-22 ENCOUNTER — Ambulatory Visit: Payer: 59 | Admitting: Vascular Surgery

## 2015-08-22 ENCOUNTER — Other Ambulatory Visit: Payer: Self-pay | Admitting: *Deleted

## 2015-08-22 DIAGNOSIS — S43491D Other sprain of right shoulder joint, subsequent encounter: Secondary | ICD-10-CM | POA: Diagnosis not present

## 2015-08-22 DIAGNOSIS — M25511 Pain in right shoulder: Secondary | ICD-10-CM | POA: Diagnosis not present

## 2015-08-22 DIAGNOSIS — I83812 Varicose veins of left lower extremities with pain: Secondary | ICD-10-CM

## 2015-08-30 MED FILL — LEVOTHYROXINE 50 MCG TABLET: 50 | 90 days supply | Qty: 135 | Fill #0

## 2015-09-05 DIAGNOSIS — L738 Other specified follicular disorders: Secondary | ICD-10-CM | POA: Diagnosis not present

## 2015-09-05 DIAGNOSIS — L573 Poikiloderma of Civatte: Secondary | ICD-10-CM | POA: Diagnosis not present

## 2015-09-05 DIAGNOSIS — L570 Actinic keratosis: Secondary | ICD-10-CM | POA: Diagnosis not present

## 2015-09-05 DIAGNOSIS — Z85828 Personal history of other malignant neoplasm of skin: Secondary | ICD-10-CM | POA: Diagnosis not present

## 2015-09-05 DIAGNOSIS — L814 Other melanin hyperpigmentation: Secondary | ICD-10-CM | POA: Diagnosis not present

## 2015-09-07 DIAGNOSIS — M76892 Other specified enthesopathies of left lower limb, excluding foot: Secondary | ICD-10-CM | POA: Diagnosis not present

## 2015-09-08 ENCOUNTER — Encounter: Payer: Self-pay | Admitting: Vascular Surgery

## 2015-09-11 MED FILL — MELOXICAM 15 MG TABLET: 15 | 30 days supply | Qty: 30 | Fill #3

## 2015-09-18 ENCOUNTER — Ambulatory Visit (INDEPENDENT_AMBULATORY_CARE_PROVIDER_SITE_OTHER): Payer: 59 | Admitting: Vascular Surgery

## 2015-09-18 ENCOUNTER — Encounter: Payer: Self-pay | Admitting: Vascular Surgery

## 2015-09-18 VITALS — BP 140/82 | HR 61 | Temp 98.0°F | Resp 16 | Ht 69.0 in | Wt 150.0 lb

## 2015-09-18 DIAGNOSIS — I83892 Varicose veins of left lower extremities with other complications: Secondary | ICD-10-CM | POA: Diagnosis not present

## 2015-09-18 NOTE — Progress Notes (Signed)
Subjective:     Patient ID: Ashley Burke, female   DOB: 08-Apr-1961, 55 y.o.   MRN: [id number]  HPI this 55 year old female had laser ablation left great saphenous vein from the distal thigh to near the saphenofemoral junction +10-20 stab phlebectomy of painful varicosities performed under local tumescent anesthesia. A total of 1489 J of energy was utilized. She tolerated the procedure well.   Review of Systems     Objective:   Physical Exam BP 140/82 mmHg  Pulse 61  Temp(Src) 98 F (36.7 C)  Resp 16  Ht 5\' 9"  (1.753 m)  Wt 150 lb (68.04 kg)  BMI 22.14 kg/m2  SpO2 100%  LMP 05/22/2012       Assessment:     Well-tolerated laser ablation left great saphenous vein plus multiple stab phlebectomy of painful varicosities performed under local tumescent anesthesia    Plan:     Return next week for venous duplex exam to confirm closure left great saphenous vein Patient will then return for sclerotherapy to complete her treatment regimen

## 2015-09-18 NOTE — Progress Notes (Signed)
Laser Ablation Procedure    Date: 09/18/2015   Ashley Burke DOB:[birth year]  Consent signed: Yes    Surgeon:  Dr. Nelda Severe. Kellie Simmering  Procedure: Laser Ablation: left Greater Saphenous Vein  BP 140/82 mmHg  Pulse 61  Temp(Src) 98 F (36.7 C)  Resp 16  Ht 5\' 9"  (1.753 m)  Wt 150 lb (68.04 kg)  BMI 22.14 kg/m2  SpO2 100%  LMP 05/22/2012  Tumescent Anesthesia: 430 cc 0.9% NaCl with 50 cc Lidocaine HCL with 1% Epi and 15 cc 8.4% NaHCO3  Local Anesthesia: 9 cc Lidocaine HCL and NaHCO3 (ratio 2:1)  Pulsed Mode: 15 watts, 535ms delay, 1.0 duration  Total Energy:   1489               Stab Phlebectomy: 10-20 Sites: Calf  Patient tolerated procedure well  Notes:   Description of Procedure:  After marking the course of the secondary varicosities, the patient was placed on the operating table in the supine position, and the left leg was prepped and draped in sterile fashion.   Local anesthetic was administered and under ultrasound guidance the saphenous vein was accessed with a micro needle and guide wire; then the mirco puncture sheath was placed.  A guide wire was inserted saphenofemoral junction , followed by a 5 french sheath.  The position of the sheath and then the laser fiber below the junction was confirmed using the ultrasound.  Tumescent anesthesia was administered along the course of the saphenous vein using ultrasound guidance. The patient was placed in Trendelenburg position and protective laser glasses were placed on patient and staff, and the laser was fired at 15 watts continuous mode advancing 1-59mm/second for a total of 1489 joules.   For stab phlebectomies, local anesthetic was administered at the previously marked varicosities, and tumescent anesthesia was administered around the vessels.  Ten to 20 stab wounds were made using the tip of an 11 blade. And using the vein hook, the phlebectomies were performed using a hemostat to avulse the varicosities.  Adequate  hemostasis was achieved.     Steri strips were applied to the stab wounds and ABD pads and thigh high compression stockings were applied.  Ace wrap bandages were applied over the phlebectomy sites and at the top of the saphenofemoral junction. Blood loss was less than 15 cc.  The patient ambulated out of the operating room having tolerated the procedure well.

## 2015-09-18 NOTE — Progress Notes (Signed)
Filed Vitals:   09/18/15 1050 09/18/15 1051  BP: 150/91 140/82  Pulse: 61   Temp: 98 F (36.7 C)   Resp: 16   Height: 5\' 9"  (1.753 m)   Weight: 150 lb (68.04 kg)   SpO2: 100%

## 2015-09-19 ENCOUNTER — Telehealth: Payer: Self-pay | Admitting: *Deleted

## 2015-09-19 ENCOUNTER — Ambulatory Visit: Payer: 59 | Admitting: Vascular Surgery

## 2015-09-19 NOTE — Telephone Encounter (Signed)
Pt doing well. No bleeding from the stab sites. Following all instructions. She'll loosen the ace wraps a bit to be more comfortable.

## 2015-09-20 ENCOUNTER — Encounter: Payer: Self-pay | Admitting: Vascular Surgery

## 2015-09-25 MED FILL — ROSUVASTATIN CALCIUM 20 MG: 20 | 90 days supply | Qty: 90 | Fill #3

## 2015-09-26 ENCOUNTER — Encounter: Payer: Self-pay | Admitting: Vascular Surgery

## 2015-09-27 ENCOUNTER — Ambulatory Visit (HOSPITAL_COMMUNITY)
Admission: RE | Admit: 2015-09-27 | Discharge: 2015-09-27 | Disposition: A | Discharge status: Home / Self Care | Payer: 59 | Source: Ambulatory Visit | Attending: Vascular Surgery | Admitting: Vascular Surgery

## 2015-09-27 DIAGNOSIS — I83892 Varicose veins of left lower extremities with other complications: Secondary | ICD-10-CM

## 2015-09-27 DIAGNOSIS — I83812 Varicose veins of left lower extremities with pain: Secondary | ICD-10-CM | POA: Diagnosis not present

## 2015-09-27 DIAGNOSIS — Z9889 Other specified postprocedural states: Secondary | ICD-10-CM | POA: Diagnosis not present

## 2015-10-02 ENCOUNTER — Ambulatory Visit: Payer: 59 | Admitting: Vascular Surgery

## 2015-10-03 ENCOUNTER — Ambulatory Visit (INDEPENDENT_AMBULATORY_CARE_PROVIDER_SITE_OTHER): Payer: Self-pay | Admitting: Vascular Surgery

## 2015-10-03 ENCOUNTER — Encounter: Payer: Self-pay | Admitting: Vascular Surgery

## 2015-10-03 VITALS — BP 138/83 | HR 67 | Temp 98.0°F | Resp 16 | Ht 69.0 in | Wt 147.0 lb

## 2015-10-03 DIAGNOSIS — I83892 Varicose veins of left lower extremities with other complications: Secondary | ICD-10-CM

## 2015-10-03 NOTE — Progress Notes (Signed)
Subjective:     Patient ID: Ashley Burke, female   DOB: 1960/12/09, 55 y.o.   MRN: [id number]  HPI this 55 year old female returns 1 week post-laser ablation left great saphenous vein plus multiple stab phlebectomy of painful varicosities. She states her leg feels much better with less heaviness and less edema in the ankle. She has worn her long leg elastic compression stockings and take an ibuprofen as instructed. She has no complaints.   Review of Systems     Objective:   Physical Exam BP 138/83 mmHg  Pulse 67  Temp(Src) 98 F (36.7 C)  Resp 16  Ht 5\' 9"  (1.753 m)  Wt 147 lb (66.679 kg)  BMI 21.70 kg/m2  SpO2 99%  LMP 05/22/2012  Gen. well-developed well-nourished female no apparent distress alert and oriented 3 Left leg with mild tenderness to deep palpation over great saphenous vein up to the inguinal area. No distal edema noted. Stab phlebectomy sites are nicely healed. 3+ dorsalis pedis pulse palpable.  She had a recent venous duplex exam which I reviewed and interpreted. There is no DVT. There is total closure of the left great saphenous vein up to near the saphenofemoral junction     Assessment:     Successful laser ablation left great saphenous vein with multiple stab phlebectomy of painful varicosities    Plan:     Patient to return in 3 weeks for sclerotherapy of residual varicosities and this will complete her treatment regimen

## 2015-10-04 ENCOUNTER — Observation Stay (HOSPITAL_BASED_OUTPATIENT_CLINIC_OR_DEPARTMENT_OTHER)
Admission: EM | Admit: 2015-10-04 | Discharge: 2015-10-05 | Disposition: A | Discharge status: Home / Self Care | Payer: 59 | Attending: Internal Medicine | Admitting: Internal Medicine

## 2015-10-04 ENCOUNTER — Encounter (HOSPITAL_BASED_OUTPATIENT_CLINIC_OR_DEPARTMENT_OTHER): Payer: Self-pay

## 2015-10-04 ENCOUNTER — Emergency Department (HOSPITAL_BASED_OUTPATIENT_CLINIC_OR_DEPARTMENT_OTHER): Payer: 59

## 2015-10-04 DIAGNOSIS — N281 Cyst of kidney, acquired: Secondary | ICD-10-CM | POA: Diagnosis not present

## 2015-10-04 DIAGNOSIS — D72829 Elevated white blood cell count, unspecified: Secondary | ICD-10-CM | POA: Diagnosis not present

## 2015-10-04 DIAGNOSIS — R109 Unspecified abdominal pain: Secondary | ICD-10-CM | POA: Diagnosis not present

## 2015-10-04 DIAGNOSIS — E785 Hyperlipidemia, unspecified: Secondary | ICD-10-CM | POA: Insufficient documentation

## 2015-10-04 DIAGNOSIS — K56600 Partial intestinal obstruction, unspecified as to cause: Secondary | ICD-10-CM

## 2015-10-04 DIAGNOSIS — K381 Appendicular concretions: Secondary | ICD-10-CM | POA: Diagnosis not present

## 2015-10-04 DIAGNOSIS — I700 Atherosclerosis of aorta: Secondary | ICD-10-CM | POA: Insufficient documentation

## 2015-10-04 DIAGNOSIS — K5669 Other intestinal obstruction: Secondary | ICD-10-CM | POA: Diagnosis not present

## 2015-10-04 DIAGNOSIS — E860 Dehydration: Secondary | ICD-10-CM | POA: Diagnosis not present

## 2015-10-04 DIAGNOSIS — E7800 Pure hypercholesterolemia, unspecified: Secondary | ICD-10-CM | POA: Insufficient documentation

## 2015-10-04 DIAGNOSIS — E039 Hypothyroidism, unspecified: Secondary | ICD-10-CM | POA: Insufficient documentation

## 2015-10-04 DIAGNOSIS — I10 Essential (primary) hypertension: Secondary | ICD-10-CM | POA: Diagnosis not present

## 2015-10-04 DIAGNOSIS — Z888 Allergy status to other drugs, medicaments and biological substances status: Secondary | ICD-10-CM | POA: Insufficient documentation

## 2015-10-04 DIAGNOSIS — D7289 Other specified disorders of white blood cells: Secondary | ICD-10-CM | POA: Diagnosis not present

## 2015-10-04 HISTORY — DX: Hypothyroidism, unspecified: E03.9

## 2015-10-04 LAB — URINALYSIS, ROUTINE W REFLEX MICROSCOPIC
Bilirubin Urine: NEGATIVE
Glucose, UA: NEGATIVE mg/dL
HGB URINE DIPSTICK: NEGATIVE
KETONES UR: 15 mg/dL — AB
LEUKOCYTES UA: NEGATIVE
Nitrite: NEGATIVE
PROTEIN: NEGATIVE mg/dL
Specific Gravity, Urine: 1.046 — ABNORMAL HIGH (ref 1.005–1.030)
pH: 5.5 (ref 5.0–8.0)

## 2015-10-04 LAB — CBC
HCT: 41.5 % (ref 36.0–46.0)
HEMOGLOBIN: 14.4 g/dL (ref 12.0–15.0)
MCH: 31.2 pg (ref 26.0–34.0)
MCHC: 34.7 g/dL (ref 30.0–36.0)
MCV: 89.8 fL (ref 78.0–100.0)
Platelets: 379 10*3/uL (ref 150–400)
RBC: 4.62 MIL/uL (ref 3.87–5.11)
RDW: 14 % (ref 11.5–15.5)
WBC: 18 10*3/uL — ABNORMAL HIGH (ref 4.0–10.5)

## 2015-10-04 LAB — COMPREHENSIVE METABOLIC PANEL
ALK PHOS: 92 U/L (ref 38–126)
ALT: 20 U/L (ref 14–54)
ANION GAP: 9 (ref 5–15)
AST: 26 U/L (ref 15–41)
Albumin: 4.2 g/dL (ref 3.5–5.0)
BILIRUBIN TOTAL: 0.6 mg/dL (ref 0.3–1.2)
BUN: 24 mg/dL — AB (ref 6–20)
CALCIUM: 9.5 mg/dL (ref 8.9–10.3)
CO2: 24 mmol/L (ref 22–32)
Chloride: 106 mmol/L (ref 101–111)
Creatinine, Ser: 0.86 mg/dL (ref 0.44–1.00)
GFR calc Af Amer: >60 mL/min (ref >60)
Glucose, Bld: 137 mg/dL — ABNORMAL HIGH (ref 65–99)
POTASSIUM: 4.9 mmol/L (ref 3.5–5.1)
Sodium: 139 mmol/L (ref 135–145)
Total Protein: 7.4 g/dL (ref 6.5–8.1)

## 2015-10-04 LAB — I-STAT CG4 LACTIC ACID, ED: LACTIC ACID, VENOUS: 0.91 mmol/L (ref 0.5–2.0)

## 2015-10-04 MED ORDER — SODIUM CHLORIDE 0.9 % IV BOLUS (SEPSIS)
1000.0000 mL | Freq: Once | INTRAVENOUS | Status: AC
  Administered 2015-10-04: 1000 mL via INTRAVENOUS

## 2015-10-04 MED ORDER — IOPAMIDOL (ISOVUE-300) INJECTION 61%
100.0000 mL | Freq: Once | INTRAVENOUS | Status: AC | PRN
  Administered 2015-10-04: 100 mL via INTRAVENOUS

## 2015-10-04 NOTE — ED Notes (Signed)
Abd pain,n/v-started 3pm-pale-clammy-presents to triage in w/c

## 2015-10-04 NOTE — ED Notes (Signed)
MD at bedside. 

## 2015-10-04 NOTE — ED Notes (Signed)
Family at bedside. 

## 2015-10-04 NOTE — ED Provider Notes (Signed)
CSN: [id number]     Arrival date & time 10/04/15  1913 History  By signing my name below, I, Ashley Burke, attest that this documentation has been prepared under the direction and in the presence of Ashley Kocher, MD. Electronically Signed: Doran Burke, ED Scribe. 10/04/2015. 8:17 PM.  Chief Complaint  Patient presents with  . Abdominal Pain   The history is provided by the patient. No language interpreter was used.   HPI Comments: Ashley Burke is a 55 y.o. female with a PMHx of HTN, HLD, and Thyroid Disease, who presents to the Emergency Department complaining of mid to left-sided abdominal pain that suddenly began at 3:30 PM, ~ 5 hours ago while at work. Pt reports worsening pain with palpation however is pain free during her exam. Pt also reports nausea and vomiting. Pt has not taken any OTC medications for pain. Pt denies any fevers, chills, CP, SOB, changes in her BM or any other symptoms at this time. NKDA Upon arrival pt was actively vomiting, blood pressure low, diaphoretic.  Quickly after arrival symptoms resolved.   Past Medical History  Diagnosis Date  . Hypertension   . Thyroid disease   . High cholesterol   . Varicose veins   . Leg swelling   . Trochanteric bursitis    Past Surgical History  Procedure Laterality Date  . Knee surgery     Family History  Problem Relation Age of Onset  . Cancer Mother     liver  . Cancer Father     lung  . Heart disease Father    Social History  Substance Use Topics  . Smoking status: Never Smoker   . Smokeless tobacco: None  . Alcohol Use: No   OB History    No data available     Review of Systems  Constitutional: Negative for fever and chills.  Respiratory: Negative for shortness of breath.   Cardiovascular: Negative for chest pain.  Gastrointestinal: Positive for nausea, vomiting and abdominal pain.  All other systems reviewed and are negative.   Allergies  Amlodipine besylate  Home Medications   Prior  to Admission medications   Medication Sig Start Date End Date Taking? Authorizing Provider  azithromycin (ZITHROMAX Z-PAK) 250 MG tablet Use as directed Patient not taking: Reported on 01/23/2015 05/03/13   Liam Graham, PA-C  BENZONATATE PO Take by mouth. Reported on 07/31/2015    Historical Provider, MD  budesonide (RHINOCORT AQUA) 32 MCG/ACT nasal spray Place 1 spray into both nostrils as needed for rhinitis.    Historical Provider, MD  chlorpheniramine-HYDROcodone (TUSSIONEX PENNKINETIC ER) 10-8 MG/5ML LQCR Take 5 mLs by mouth every 12 (twelve) hours as needed for cough. Patient not taking: Reported on 01/23/2015 05/03/13   Liam Graham, PA-C  Coenzyme Q10 (COQ10) 200 MG CAPS Take 200 mg by mouth 1 dose over 46 hours.      Historical Provider, MD  cyclobenzaprine (FLEXERIL) 5 MG tablet TAKE 1/2- 1 TABLET AT BEDTIME AS NEEDED Patient not taking: Reported on 07/31/2015 07/31/15   Dickie La, MD  dextromethorphan [facility]) 30 MG/5ML liquid Take by mouth as needed for cough. Reported on 07/31/2015    Historical Provider, MD  hydrochlorothiazide (,MICROZIDE/HYDRODIURIL,) 12.5 MG capsule Take 12.5 mg by mouth daily. Reported on 07/31/2015    Historical Provider, MD  levothyroxine (LEVOXYL) 75 MCG tablet Take 50 mcg by mouth daily. Reported on 06/20/2015    Historical Provider, MD  Multiple Vitamins-Minerals (MULTIVITAMIN WITH MINERALS) tablet Take 1 tablet  by mouth daily.      Historical Provider, MD  norethindrone-ethinyl estradiol-iron (MICROGESTIN FE1.5/30) 1.5-30 MG-MCG tablet Take 1 tablet by mouth daily. Reported on 07/31/2015    Historical Provider, MD  rosuvastatin (CRESTOR) 20 MG tablet Take 20 mg by mouth daily.      Historical Provider, MD  vitamin B-12 (CYANOCOBALAMIN) 1000 MCG tablet Take 1,000 mcg by mouth daily.    Historical Provider, MD   BP 139/73 mmHg  Pulse 79  Temp(Src) 98.6 F (37 C) (Rectal)  Resp 20  Ht 5\' 9"  (1.753 m)  Wt 145 lb (65.772 kg)  BMI 21.40 kg/m2  SpO2 99%   LMP 05/22/2012  Vitals reviewed Physical Exam  Physical Examination: General appearance - alert, well appearing, and in no distress Mental status - alert, oriented to person, place, and time Eyes - no conjunctival injection, no scleral icterus Mouth - mucous membranes moist, pharynx normal without lesions Chest - clear to auscultation, no wheezes, rales or rhonchi, symmetric air entry Heart - normal rate, regular rhythm, normal S1, S2, no murmurs, rubs, clicks or gallops Abdomen - soft, ttp in left lower abdomen, nabs, no gaurding or rebound tenderness, nondistended, no masses or organomegaly Neurological - alert, oriented, normal speech Extremities - peripheral pulses normal, no pedal edema, no clubbing or cyanosis Skin - normal coloration and turgor, no rashes  ED Course  Procedures  DIAGNOSTIC STUDIES: Oxygen Saturation is 96% on room air, normal by my interpretation.    COORDINATION OF CARE: 8:11 PM Will give fluids. Will order CTA abdomen, blood work, and urinalysis. Discussed treatment plan with pt at bedside and pt agreed to plan.  Labs Review Labs Reviewed  CBC - Abnormal; Notable for the following:    WBC 18.0 (*)    All other components within normal limits  COMPREHENSIVE METABOLIC PANEL - Abnormal; Notable for the following:    Glucose, Bld 137 (*)    BUN 24 (*)    All other components within normal limits  URINALYSIS, ROUTINE W REFLEX MICROSCOPIC (NOT AT [facility]) - Abnormal; Notable for the following:    Specific Gravity, Urine 1.046 (*)    Ketones, ur 15 (*)    All other components within normal limits  I-STAT CG4 LACTIC ACID, ED   Imaging Review Ct Abdomen Pelvis W Contrast  10/04/2015  CLINICAL DATA:  Left-sided abdominal pain with nausea and vomiting, 6 hours duration. EXAM: CT ABDOMEN AND PELVIS WITH CONTRAST TECHNIQUE: Multidetector CT imaging of the abdomen and pelvis was performed using the standard protocol following bolus administration of intravenous  contrast. CONTRAST:  172mL ISOVUE-300 IOPAMIDOL (ISOVUE-300) INJECTION 61% COMPARISON:  None. FINDINGS: Lung bases are clear. No pleural or pericardial fluid. The liver has a normal appearance without focal lesions or biliary ductal dilatation. No calcified gallstones. The spleen is normal. The pancreas is normal. The adrenal glands are normal. The kidneys are normal except for a sub cm cyst on the right. There is atherosclerosis of the aorta but no aneurysm. The IVC is normal. No retroperitoneal adenopathy. The uterus is diminutive in shows a benign appearing calcification. There may have been partial hysterectomy. No pelvic mass. The bladder appears normal. No evidence of appendicitis. The patient does have a calcification within the appendix. There is fluid throughout the small intestine an the right colon, and there are some air bubble patterns in the small intestine that suggest we may be dealing with a partial small bowel obstruction. Certainly, contents are getting beyond that. There is a small  amount of ascites adjacent to the liver. No bone abnormality. IMPRESSION: Suspicion of partial small bowel obstruction. Etiology indeterminate. Appendicolith but without evidence of appendicitis. Electronically Signed   By: Nelson Chimes M.D.   On: 10/04/2015 21:48   I have personally reviewed and evaluated these images and lab results as part of my medical decision-making.   EKG Interpretation None      MDM   Final diagnoses:  Partial small bowel obstruction (HCC)  Leukocytosis    Pt presenting with mid abdominal pain and vomiting.  On arrival patient with severe pain, active vomiting, hypotensive.  IV initiated with NS bolus- blood pressure responded well, pain resolved and no further vomiting.  Pt found to have leukocytosis and some ttp in left lower abdomen.  CT scan obtained and shows possible early small bowel obstruction.  Discussed results with patient, she continues to decline pain medication.   She is agreeable with plan for observation.  Discharged with strict return precautions.  Pt agreeable with plan.  I personally performed the services described in this documentation, which was scribed in my presence. The recorded information has been reviewed and is accurate.   11:24 PM d/w Dr. Eulas Post, triad hospitalist, she accepts patient for admission- med/surg, observation.    Alfonzo Beers, MD 10/04/15 [phone]

## 2015-10-05 ENCOUNTER — Encounter (HOSPITAL_COMMUNITY): Payer: Self-pay | Admitting: Family Medicine

## 2015-10-05 DIAGNOSIS — D72829 Elevated white blood cell count, unspecified: Secondary | ICD-10-CM

## 2015-10-05 DIAGNOSIS — E860 Dehydration: Secondary | ICD-10-CM | POA: Diagnosis not present

## 2015-10-05 DIAGNOSIS — E039 Hypothyroidism, unspecified: Secondary | ICD-10-CM | POA: Diagnosis not present

## 2015-10-05 DIAGNOSIS — E038 Other specified hypothyroidism: Secondary | ICD-10-CM

## 2015-10-05 DIAGNOSIS — R1084 Generalized abdominal pain: Secondary | ICD-10-CM | POA: Diagnosis not present

## 2015-10-05 DIAGNOSIS — I10 Essential (primary) hypertension: Secondary | ICD-10-CM | POA: Diagnosis not present

## 2015-10-05 DIAGNOSIS — E785 Hyperlipidemia, unspecified: Secondary | ICD-10-CM | POA: Diagnosis not present

## 2015-10-05 DIAGNOSIS — K5660 Unspecified intestinal obstruction: Secondary | ICD-10-CM | POA: Diagnosis not present

## 2015-10-05 DIAGNOSIS — I700 Atherosclerosis of aorta: Secondary | ICD-10-CM | POA: Diagnosis not present

## 2015-10-05 DIAGNOSIS — N281 Cyst of kidney, acquired: Secondary | ICD-10-CM | POA: Diagnosis not present

## 2015-10-05 DIAGNOSIS — K381 Appendicular concretions: Secondary | ICD-10-CM | POA: Diagnosis not present

## 2015-10-05 LAB — CBC
HCT: 37.3 % (ref 36.0–46.0)
HEMOGLOBIN: 12.3 g/dL (ref 12.0–15.0)
MCH: 29.4 pg (ref 26.0–34.0)
MCHC: 33 g/dL (ref 30.0–36.0)
MCV: 89.2 fL (ref 78.0–100.0)
Platelets: 320 10*3/uL (ref 150–400)
RBC: 4.18 MIL/uL (ref 3.87–5.11)
RDW: 13.8 % (ref 11.5–15.5)
WBC: 9.8 10*3/uL (ref 4.0–10.5)

## 2015-10-05 LAB — COMPREHENSIVE METABOLIC PANEL
ALBUMIN: 3.2 g/dL — AB (ref 3.5–5.0)
ALK PHOS: 73 U/L (ref 38–126)
ALT: 16 U/L (ref 14–54)
ANION GAP: 6 (ref 5–15)
AST: 23 U/L (ref 15–41)
BILIRUBIN TOTAL: 0.5 mg/dL (ref 0.3–1.2)
BUN: 12 mg/dL (ref 6–20)
CALCIUM: 8.9 mg/dL (ref 8.9–10.3)
CHLORIDE: 112 mmol/L — AB (ref 101–111)
CO2: 22 mmol/L (ref 22–32)
Creatinine, Ser: 0.71 mg/dL (ref 0.44–1.00)
GFR calc non Af Amer: >60 mL/min (ref >60)
GLUCOSE: 85 mg/dL (ref 65–99)
Potassium: 4.4 mmol/L (ref 3.5–5.1)
Sodium: 140 mmol/L (ref 135–145)
Total Protein: 5.5 g/dL — ABNORMAL LOW (ref 6.5–8.1)

## 2015-10-05 MED ORDER — ACETAMINOPHEN 650 MG RE SUPP: 650.0000 mg | Freq: Four times a day (QID) | RECTAL | Status: DC | PRN

## 2015-10-05 MED ORDER — ONDANSETRON HCL 4 MG/2ML IJ SOLN
4.0000 mg | Freq: Four times a day (QID) | INTRAMUSCULAR | Status: DC | PRN
  Administered 2015-10-05: 4 mg via INTRAVENOUS
  Filled 2015-10-05: qty 2

## 2015-10-05 MED ORDER — ONDANSETRON HCL 4 MG PO TABS: 4.0000 mg | ORAL_TABLET | Freq: Four times a day (QID) | ORAL | Status: DC | PRN

## 2015-10-05 MED ORDER — ACETAMINOPHEN 325 MG PO TABS
650.0000 mg | ORAL_TABLET | Freq: Four times a day (QID) | ORAL | Status: DC | PRN
  Administered 2015-10-05: 650 mg via ORAL
  Filled 2015-10-05: qty 2

## 2015-10-05 MED ORDER — HYDROMORPHONE HCL 1 MG/ML IJ SOLN
0.5000 mg | INTRAMUSCULAR | Status: DC | PRN
  Administered 2015-10-05: 1 mg via INTRAVENOUS
  Filled 2015-10-05: qty 1

## 2015-10-05 MED ORDER — LEVOTHYROXINE SODIUM 50 MCG PO TABS: 50.0000 ug | ORAL_TABLET | Freq: Every day | ORAL | Status: DC

## 2015-10-05 NOTE — H&P (Signed)
History and Physical  Patient Name: Ashley Burke     [id number]    DOB: [birth year]    DOA: 10/04/2015 PCP: Beatris Si   Patient coming from: Home  Chief Complaint: Abdominal pain  HPI: Ashley Burke is a 55 y.o. female with a past medical history significant for hypothyroidism and hyperlipidemia who presents with abdominal pain.  The patient was in her usual state of health until today at 3:00PM while in a meeting, she started to develop "all over" abdominal pain, "like gas".  This progressively got worse over the next hour, she drove home, took some Gas-X but didn't get better, threw up and her husband drove her to Urgent Care and then the ER, vomiting NBNB emesis a few more times.  ED course: -She was afebrile, initially clammy and hypotensive, but mentating well -Na 139, K 4.9, Cr 24 (baseline 0.8, elevated BUN-creatinine ratio), WBC 18K, Hgb 14, lactic acid normal, UA with ketones -CT abdomen and pelvis with contrast showed air in the small bowel but no transition point consistent with partial SBO but not definitive, negative for diverticulitis, pancreatitis, GU problem or appendicitis -She was given 2L NS and her hemodynamics improved and her pain improved.    On arrival to [facility], her pain was resolved, and she had two loose bowel movements, brown in color.  She had no recent travel history other than to [place] to a resort several months ago, contact with untreated water, food exposures that she was suspicious of.  She has no sick contacts.  Of note, she is taking an OTC supplement Plexus Slim which contains prebiotics oligosaccharides, for the last week.  She has never had an SBO, abdominal surgery or hernia.     Review of Systems:  All other systems negative except as just noted or noted in the history of present illness.    Past Medical History  Diagnosis Date  . Hypertension   . Thyroid disease   . High cholesterol   . Varicose veins   . Leg swelling    . Trochanteric bursitis   . Hypothyroidism     Past Surgical History  Procedure Laterality Date  . Knee surgery      Social History: Patient lives with her husband.  The patient walks unassisted.  She does not smoke.    Allergies  Allergen Reactions  . Amlodipine Besylate Other (See Comments)    dizziness    Family history: family history includes Cancer in her father and mother; Heart disease in her father.  Prior to Admission medications   Medication Sig Start Date End Date Taking? Authorizing Provider  levothyroxine (LEVOXYL) 75 MCG tablet Take 50 mcg by mouth daily. Reported on 06/20/2015   Yes Historical Provider, MD  rosuvastatin (CRESTOR) 20 MG tablet Take 20 mg by mouth daily.     Yes Historical Provider, MD  budesonide (RHINOCORT AQUA) 32 MCG/ACT nasal spray Place 1 spray into both nostrils as needed for rhinitis.    Historical Provider, MD  Multiple Vitamins-Minerals (MULTIVITAMIN WITH MINERALS) tablet Take 1 tablet by mouth daily.      Historical Provider, MD  norethindrone-ethinyl estradiol-iron (MICROGESTIN FE1.5/30) 1.5-30 MG-MCG tablet Take 1 tablet by mouth daily. Reported on 07/31/2015    Historical Provider, MD       Physical Exam: BP 134/77 mmHg  Pulse 68  Temp(Src) 98.8 F (37.1 C) (Oral)  Resp 18  Ht 5\' 8"  (1.727 m)  Wt 65.772 kg (145 lb)  BMI 22.05 kg/m2  SpO2 99%  LMP 05/22/2012 General appearance: Well-developed, adult female, alert and in no acute distress.   Eyes: Anicteric, conjunctiva pink, lids and lashes normal.     ENT: No nasal deformity, discharge, or epistaxis.  OP moist without lesions.   Skin: Warm and dry.  No jaundice.  No suspicious rashes or lesions. Cardiac: RRR, nl S1-S2, no murmurs appreciated.  Capillary refill is brisk.  JVP normal.  No LE edema.  Radial pulses 2+ and symmetric. Respiratory: Normal respiratory rate and rhythm.  CTAB without rales or wheezes. Abdomen: Abdomen soft without rigidity.  No TTP or guarding, benign  exam. No ascites, distension.   MSK: No deformities or effusions. Neuro: Sensorium intact and responding to questions, attention normal.  Speech is fluent.  Moves all extremities equally and with normal coordination.    Psych: Behavior appropriate.  Affect normal.  No evidence of aural or visual hallucinations or delusions.       Labs on Admission:  I have personally reviewed following labs and imaging studies: CBC:  Recent Labs Lab 10/04/15 1930  WBC 18.0*  HGB 14.4  HCT 41.5  MCV 89.8  PLT [id number]   Basic Metabolic Panel:  Recent Labs Lab 10/04/15 1930  NA 139  K 4.9  CL 106  CO2 24  GLUCOSE 137*  BUN 24*  CREATININE 0.86  CALCIUM 9.5   GFR: Estimated Creatinine Clearance: 73.7 mL/min (by C-G formula based on Cr of 0.86). Liver Function Tests:  Recent Labs Lab 10/04/15 1930  AST 26  ALT 20  ALKPHOS 92  BILITOT 0.6  PROT 7.4  ALBUMIN 4.2   No results for input(s): LIPASE, AMYLASE in the last 168 hours. No results for input(s): AMMONIA in the last 168 hours. Coagulation Profile: No results for input(s): INR, PROTIME in the last 168 hours. Cardiac Enzymes: No results for input(s): CKTOTAL, CKMB, CKMBINDEX, TROPONINI in the last 168 hours. BNP (last 3 results) No results for input(s): PROBNP in the last 8760 hours. HbA1C: No results for input(s): HGBA1C in the last 72 hours. CBG: No results for input(s): GLUCAP in the last 168 hours. Lipid Profile: No results for input(s): CHOL, HDL, LDLCALC, TRIG, CHOLHDL, LDLDIRECT in the last 72 hours. Thyroid Function Tests: No results for input(s): TSH, T4TOTAL, FREET4, T3FREE, THYROIDAB in the last 72 hours. Anemia Panel: No results for input(s): VITAMINB12, FOLATE, FERRITIN, TIBC, IRON, RETICCTPCT in the last 72 hours. Sepsis Labs: Lactic acid 0.9 @LABRCNTIP (procalcitonin:4,lacticidven:4) )No results found for this or any previous visit (from the past 240 hour(s)).       Radiological Exams on Admission: Ct  Abdomen Pelvis W Contrast  10/04/2015  CLINICAL DATA:  Left-sided abdominal pain with nausea and vomiting, 6 hours duration. EXAM: CT ABDOMEN AND PELVIS WITH CONTRAST TECHNIQUE: Multidetector CT imaging of the abdomen and pelvis was performed using the standard protocol following bolus administration of intravenous contrast. CONTRAST:  132mL ISOVUE-300 IOPAMIDOL (ISOVUE-300) INJECTION 61% COMPARISON:  None. FINDINGS: Lung bases are clear. No pleural or pericardial fluid. The liver has a normal appearance without focal lesions or biliary ductal dilatation. No calcified gallstones. The spleen is normal. The pancreas is normal. The adrenal glands are normal. The kidneys are normal except for a sub cm cyst on the right. There is atherosclerosis of the aorta but no aneurysm. The IVC is normal. No retroperitoneal adenopathy. The uterus is diminutive in shows a benign appearing calcification. There may have been partial hysterectomy. No pelvic mass. The bladder appears normal. No evidence of  appendicitis. The patient does have a calcification within the appendix. There is fluid throughout the small intestine an the right colon, and there are some air bubble patterns in the small intestine that suggest we may be dealing with a partial small bowel obstruction. Certainly, contents are getting beyond that. There is a small amount of ascites adjacent to the liver. No bone abnormality. IMPRESSION: Suspicion of partial small bowel obstruction. Etiology indeterminate. Appendicolith but without evidence of appendicitis. Electronically Signed   By: Nelson Chimes M.D.   On: 10/04/2015 21:48        Assessment/Plan 1. Abdominal pain:  Possibly partial SBO.  Other possibilities is that this is small bowel overgrowth from this new supplement, or just gas pain/distension from these "xylooligosaccharides" in Plexus.  Mesenteric ischemia is doubted.  Gastroenteritis is doubted, but possible now that she is complaining of diarrhea.   Will continue to observe.   -Advance diet slowly as tolerated -Ondansetron, acetaminophen and hydromorphone PRN for pain and nausea -Repeat WBC tomorrow and if trending down, and pain resolved, discharge to home with outpatient follow up   2. Dehydration:  Fluids administered in ED.  Oral rehydration if possible. -Repeat BMP after fluids  3. Hypothyroidism:  -Continue home levothyroxine      DVT prophylaxis: Low risk  Code Status: FULL  Family Communication: None present  Disposition Plan: Anticipate observation overnight, repeat CBC tomorrow. If pain does not recur, WBC trending down, and able to tolerate PO, home today. Consults called: None Admission status: Observation, med surg   Medical decision making: Patient seen at 2:45 AM on 10/05/2015. What exists of the patient's chart was reviewed in depth.  Clinical condition: stable.        Edwin Dada [facility] Pager [phone]

## 2015-10-05 NOTE — Progress Notes (Signed)
Deberah Castle Kinderman to be D/C'd  per MD order. Discussed with the patient and all questions fully answered.  VSS, Skin clean, dry and intact without evidence of skin break down, no evidence of skin tears noted.  IV catheter discontinued intact. Site without signs and symptoms of complications. Dressing and pressure applied.  An After Visit Summary was printed and given to the patient. Patient received prescription.  D/c education completed with patient/family including follow up instructions, medication list, d/c activities limitations if indicated, with other d/c instructions as indicated by MD - patient able to verbalize understanding, all questions fully answered.   Patient instructed to return to ED, call 911, or call MD for any changes in condition.   Patient to be escorted via [place], and D/C home via private auto.

## 2015-10-05 NOTE — Plan of Care (Signed)
Problem: Activity: Goal: Ability to maintain or regain function will improve Wrong care plan added. Resolved while trying to delete. Patient only needed general adult care plan for observation admission.

## 2015-10-05 NOTE — Discharge Summary (Signed)
Physician Discharge Summary  Ashley Burke [id number] DOB: 08-Feb-1961 DOA: 10/04/2015  PCP: Beatris Si  Admit date: 10/04/2015 Discharge date: 10/05/2015  Recommendations for Outpatient Follow-up:  1. No changes in medications on discharge. Patient instructed to follow-up with primary care physician in one to 2 weeks after discharge to make sure symptoms are controlled.  Discharge Diagnoses:  Principal Problem:   Abdominal pain Active Problems:   Leukocytosis   Hypothyroidism   Dehydration    Discharge Condition: stable; also go home today  Diet recommendation: as tolerated   History of present illness:   PER HPI 10/05/2015 "55 y.o. female with a past medical history significant for hypothyroidism and hyperlipidemia who presents with abdominal pain. The patient was in her usual state of health until today at 3:00PM while in a meeting, she started to develop "all over" abdominal pain, "like gas". This progressively got worse over the next hour, she drove home, took some Gas-X but didn't get better, threw up and her husband drove her to Urgent Care and then the ER, vomiting NBNB emesis a few more times. ED course: -She was afebrile, initially clammy and hypotensive, but mentating well -Na 139, K 4.9, Cr 24 (baseline 0.8, elevated BUN-creatinine ratio), WBC 18K, Hgb 14, lactic acid normal, UA with ketones -CT abdomen and pelvis with contrast showed air in the small bowel but no transition point consistent with partial SBO but not definitive, negative for diverticulitis, pancreatitis, GU problem or appendicitis -She was given 2L NS and her hemodynamics improved and her pain improved. On arrival to [facility], her pain was resolved, and she had two loose bowel movements, brown in color. She had no recent travel history other than to [place] to a resort several months ago, contact with untreated water, food exposures that she was suspicious of. She has no sick contacts. Of note,  she is taking an OTC supplement Plexus Slim which contains prebiotics oligosaccharides, for the last week. She has never had an SBO, abdominal surgery or hernia."   Hospital Course:   Principal Problem:   Abdominal pain / dehydration - Partial small bowel obstruction seen on CT scan.  - Patient has received IV fluids in ED  Please note patient did have bowel movement yesterday and today. She does not have any nausea and vomiting and so far tolerated solid food without problems - We'll follow-up on lunch time if she tolerates lunch and medically she is stable for discharge. Patient would like to go home today.  Active Problems:   Leukocytosis - Unclear etiology, likely reactive - No reports of fevers. No acute findings on CT abdomen to suggest infectious etiology - No reports of cough or GU issues - She has not been started on antibiotics    Hypothyroidism - Continue home dose Synthroid   Signed:  Leisa Lenz, MD  [facility] 10/05/2015, 9:13 AM  Pager #: [phone]  Time spent in minutes: more than 30 minutes  Procedures:  None   Consultations:  None   Discharge Exam: Filed Vitals:   10/05/15 0123 10/05/15 0448  BP: 134/77 136/63  Pulse: 68 64  Temp: 98.8 F (37.1 C) 98.6 F (37 C)  Resp: 18 18   Filed Vitals:   10/04/15 2315 10/04/15 2330 10/05/15 0123 10/05/15 0448  BP:  143/81 134/77 136/63  Pulse: 74 75 68 64  Temp:   98.8 F (37.1 C) 98.6 F (37 C)  TempSrc:   Oral Oral  Resp:   18 18  Height:  5\' 8"  (1.727 m)   Weight:      SpO2: 100% 100% 99% 99%    General: Pt is alert, follows commands appropriately, not in acute distress Cardiovascular: Regular rate and rhythm, S1/S2 +, no murmurs Respiratory: Clear to auscultation bilaterally, no wheezing, no crackles, no rhonchi Abdominal: Soft, non tender, non distended, bowel sounds +, no guarding Extremities: no edema, no cyanosis, pulses palpable bilaterally DP and PT Neuro: Grossly  nonfocal  Discharge Instructions  Discharge Instructions    Call MD for:  difficulty breathing, headache or visual disturbances    Complete by:  As directed      Call MD for:  persistant nausea and vomiting    Complete by:  As directed      Call MD for:  severe uncontrolled pain    Complete by:  As directed      Diet - low sodium heart healthy    Complete by:  As directed      Increase activity slowly    Complete by:  As directed             Medication List    TAKE these medications        LEVOXYL 75 MCG tablet  Generic drug:  levothyroxine  Take 50 mcg by mouth daily. Reported on 06/20/2015     multivitamin with minerals tablet  Take 1 tablet by mouth daily.     norethindrone-ethinyl estradiol-iron 1.5-30 MG-MCG tablet  Commonly known as:  MICROGESTIN FE,GILDESS FE,LOESTRIN FE  Take 1 tablet by mouth daily. Reported on 07/31/2015     RHINOCORT AQUA 32 MCG/ACT nasal spray  Generic drug:  budesonide  Place 1 spray into both nostrils as needed for rhinitis.     rosuvastatin 20 MG tablet  Commonly known as:  CRESTOR  Take 20 mg by mouth daily.           Follow-up Information    Follow up with HEPLER,MARK, PA-C. Schedule an appointment as soon as possible for a visit in 1 week.   Specialty:  Physician Assistant   Why:  Follow up appt after recent hospitalization   Contact information:   [address] [place] [id number] [phone]        The results of significant diagnostics from this hospitalization (including imaging, microbiology, ancillary and laboratory) are listed below for reference.    Significant Diagnostic Studies: Ct Abdomen Pelvis W Contrast  10/04/2015  CLINICAL DATA:  Left-sided abdominal pain with nausea and vomiting, 6 hours duration. EXAM: CT ABDOMEN AND PELVIS WITH CONTRAST TECHNIQUE: Multidetector CT imaging of the abdomen and pelvis was performed using the standard protocol following bolus administration of intravenous contrast.  CONTRAST:  176mL ISOVUE-300 IOPAMIDOL (ISOVUE-300) INJECTION 61% COMPARISON:  None. FINDINGS: Lung bases are clear. No pleural or pericardial fluid. The liver has a normal appearance without focal lesions or biliary ductal dilatation. No calcified gallstones. The spleen is normal. The pancreas is normal. The adrenal glands are normal. The kidneys are normal except for a sub cm cyst on the right. There is atherosclerosis of the aorta but no aneurysm. The IVC is normal. No retroperitoneal adenopathy. The uterus is diminutive in shows a benign appearing calcification. There may have been partial hysterectomy. No pelvic mass. The bladder appears normal. No evidence of appendicitis. The patient does have a calcification within the appendix. There is fluid throughout the small intestine an the right colon, and there are some air bubble patterns in the small intestine  that suggest we may be dealing with a partial small bowel obstruction. Certainly, contents are getting beyond that. There is a small amount of ascites adjacent to the liver. No bone abnormality. IMPRESSION: Suspicion of partial small bowel obstruction. Etiology indeterminate. Appendicolith but without evidence of appendicitis. Electronically Signed   By: Nelson Chimes M.D.   On: 10/04/2015 21:48    Microbiology: No results found for this or any previous visit (from the past 240 hour(s)).   Labs: Basic Metabolic Panel:  Recent Labs Lab 10/04/15 1930  NA 139  K 4.9  CL 106  CO2 24  GLUCOSE 137*  BUN 24*  CREATININE 0.86  CALCIUM 9.5   Liver Function Tests:  Recent Labs Lab 10/04/15 1930  AST 26  ALT 20  ALKPHOS 92  BILITOT 0.6  PROT 7.4  ALBUMIN 4.2   No results for input(s): LIPASE, AMYLASE in the last 168 hours. No results for input(s): AMMONIA in the last 168 hours. CBC:  Recent Labs Lab 10/04/15 1930  WBC 18.0*  HGB 14.4  HCT 41.5  MCV 89.8  PLT 379   Cardiac Enzymes: No results for input(s): CKTOTAL, CKMB,  CKMBINDEX, TROPONINI in the last 168 hours. BNP: BNP (last 3 results) No results for input(s): BNP in the last 8760 hours.  ProBNP (last 3 results) No results for input(s): PROBNP in the last 8760 hours.  CBG: No results for input(s): GLUCAP in the last 168 hours.

## 2015-10-05 NOTE — Discharge Instructions (Signed)
Small Bowel Obstruction °A small bowel obstruction is a blockage in the small bowel. The small bowel, which is also called the small intestine, is a long, slender tube that connects the stomach to the colon. When a person eats and drinks, food and fluids go from the stomach to the small bowel. This is where most of the nutrients in the food and fluids are absorbed. °A small bowel obstruction will prevent food and fluids from passing through the small bowel as they normally do during digestion. The small bowel can become partially or completely blocked. This can cause symptoms such as abdominal pain, vomiting, and bloating. If this condition is not treated, it can be dangerous because the small bowel could rupture. °CAUSES °Common causes of this condition include: °· Scar tissue from previous surgery or radiation treatment. °· Recent surgery. This may cause the movements of the bowel to slow down and cause food to block the intestine. °· Hernias. °· Inflammatory bowel disease (colitis). °· Twisting of the bowel (volvulus). °· Tumors. °· A foreign body. °· Slipping of a part of the bowel into another part (intussusception). °SYMPTOMS °Symptoms of this condition include: °· Abdominal pain. This may be dull cramps or sharp pain. It may occur in one area, or it may be present in the entire abdomen. Pain can range from mild to severe, depending on the degree of obstruction. °· Nausea and vomiting. Vomit may be greenish or a yellow bile color. °· Abdominal bloating. °· Constipation. °· Lack of passing gas. °· Frequent belching. °· Diarrhea. This may occur if the obstruction is partial and runny stool is able to leak around the obstruction. °DIAGNOSIS °This condition may be diagnosed based on a physical exam, medical history, and X-rays of the abdomen. You may also have other tests, such as a CT scan of the abdomen and pelvis. °TREATMENT °Treatment for this condition depends on the cause and severity of the problem.  Treatment options may include: °· Bed rest along with fluids and pain medicines that are given through an IV tube inserted into one of your veins. Sometimes, this is all that is needed for the obstruction to improve. °· Following a simple diet. In some cases, a clear liquid diet may be required for several days. This allows the bowel to rest. °· Placement of a small tube (nasogastric tube) into the stomach. When the bowel is blocked, it usually swells up like a balloon that is filled with air and fluids. The air and fluids may be removed by suction through the nasogastric tube. This can help with pain, discomfort, and nausea. It can also help the obstruction to clear up faster. °· Surgery. This may be required if other treatments do not work. Bowel obstruction from a hernia may require early surgery and can be an emergency procedure. Surgery may also be required for scar tissue that causes frequent or severe obstructions. °HOME CARE INSTRUCTIONS °· Get plenty of rest. °· Follow instructions from your health care provider about eating restrictions. You may need to avoid solid foods and consume only clear liquids until your condition improves. °· Take over-the-counter and prescription medicines only as told by your health care provider. °· Keep all follow-up visits as told by your health care provider. This is important. °SEEK MEDICAL CARE IF: °· You have a fever. °· You have chills. °SEEK IMMEDIATE MEDICAL CARE IF: °· You have increased pain or cramping. °· You vomit blood. °· You have uncontrolled vomiting or nausea. °· You cannot drink   fluids because of vomiting or pain. °· You develop confusion. °· You begin feeling very dry or thirsty (dehydrated). °· You have severe bloating. °· You feel extremely weak or you faint. °  °This information is not intended to replace advice given to you by your health care provider. Make sure you discuss any questions you have with your health care provider. °  °Document Released:  07/16/2005 Document Revised: 01/18/2015 Document Reviewed: 06/23/2014 °Elsevier Interactive Patient Education ©2016 [employer] ° ° °

## 2015-10-13 DIAGNOSIS — R109 Unspecified abdominal pain: Secondary | ICD-10-CM | POA: Diagnosis not present

## 2015-10-19 ENCOUNTER — Encounter: Payer: Self-pay | Admitting: *Deleted

## 2015-10-20 DIAGNOSIS — K5900 Constipation, unspecified: Secondary | ICD-10-CM | POA: Diagnosis not present

## 2015-10-20 DIAGNOSIS — R109 Unspecified abdominal pain: Secondary | ICD-10-CM | POA: Diagnosis not present

## 2015-10-20 DIAGNOSIS — R935 Abnormal findings on diagnostic imaging of other abdominal regions, including retroperitoneum: Secondary | ICD-10-CM | POA: Diagnosis not present

## 2015-10-20 DIAGNOSIS — Z1211 Encounter for screening for malignant neoplasm of colon: Secondary | ICD-10-CM | POA: Diagnosis not present

## 2015-10-25 ENCOUNTER — Ambulatory Visit (INDEPENDENT_AMBULATORY_CARE_PROVIDER_SITE_OTHER): Payer: 59 | Admitting: *Deleted

## 2015-10-25 DIAGNOSIS — I83892 Varicose veins of left lower extremities with other complications: Secondary | ICD-10-CM | POA: Diagnosis not present

## 2015-10-25 NOTE — Progress Notes (Signed)
X=.3% Sotradecol administered with a 27g butterfly.  Patient received a total of 11cc.  Treated all areas of concern (mostly reticulars). Easy access. Tol well. Anticipate good results.  Photos: No.  Compression stockings applied: Yes.

## 2015-11-01 MED FILL — FLUTICASONE PROP 50 MCG SPR: 50 | 30 days supply | Qty: 16 | Fill #2

## 2015-11-16 MED FILL — GAVILYTE-N SOLUTION: 420 | 2 days supply | Qty: 4000 | Fill #0

## 2015-11-21 DIAGNOSIS — K5730 Diverticulosis of large intestine without perforation or abscess without bleeding: Secondary | ICD-10-CM | POA: Diagnosis not present

## 2015-11-21 DIAGNOSIS — Z1211 Encounter for screening for malignant neoplasm of colon: Secondary | ICD-10-CM | POA: Diagnosis not present

## 2015-11-21 DIAGNOSIS — K635 Polyp of colon: Secondary | ICD-10-CM | POA: Diagnosis not present

## 2015-11-28 DIAGNOSIS — N952 Postmenopausal atrophic vaginitis: Secondary | ICD-10-CM | POA: Diagnosis not present

## 2015-11-28 MED FILL — ESTRACE 0.01% CREAM: 0.1 | 90 days supply | Qty: 43 | Fill #0

## 2015-11-29 MED FILL — LEVOTHYROXINE 50 MCG TABLET: 50 | 90 days supply | Qty: 135 | Fill #1

## 2016-01-05 MED FILL — FLUTICASONE PROP 50 MCG SPR: 50 | 30 days supply | Qty: 16 | Fill #3

## 2016-01-18 ENCOUNTER — Other Ambulatory Visit: Payer: Self-pay | Admitting: Gastroenterology

## 2016-01-18 DIAGNOSIS — R140 Abdominal distension (gaseous): Secondary | ICD-10-CM | POA: Diagnosis not present

## 2016-01-18 DIAGNOSIS — R1013 Epigastric pain: Secondary | ICD-10-CM

## 2016-01-18 DIAGNOSIS — K5900 Constipation, unspecified: Secondary | ICD-10-CM | POA: Diagnosis not present

## 2016-01-18 DIAGNOSIS — Z86010 Personal history of colonic polyps: Secondary | ICD-10-CM | POA: Diagnosis not present

## 2016-02-05 ENCOUNTER — Encounter (HOSPITAL_COMMUNITY)
Admission: RE | Admit: 2016-02-05 | Discharge: 2016-02-05 | Disposition: A | Discharge status: Home / Self Care | Payer: 59 | Source: Ambulatory Visit | Attending: Gastroenterology | Admitting: Gastroenterology

## 2016-02-05 ENCOUNTER — Ambulatory Visit (HOSPITAL_COMMUNITY)
Admission: RE | Admit: 2016-02-05 | Discharge: 2016-02-05 | Disposition: A | Discharge status: Home / Self Care | Payer: 59 | Source: Ambulatory Visit | Attending: Gastroenterology | Admitting: Gastroenterology

## 2016-02-05 DIAGNOSIS — R1013 Epigastric pain: Secondary | ICD-10-CM | POA: Insufficient documentation

## 2016-02-05 DIAGNOSIS — K828 Other specified diseases of gallbladder: Secondary | ICD-10-CM | POA: Diagnosis not present

## 2016-02-05 MED ORDER — TECHNETIUM TC 99M MEBROFENIN IV KIT
5.2000 | PACK | Freq: Once | INTRAVENOUS | Status: AC | PRN
  Administered 2016-02-05: 5 via INTRAVENOUS

## 2016-02-06 DIAGNOSIS — M7581 Other shoulder lesions, right shoulder: Secondary | ICD-10-CM | POA: Diagnosis not present

## 2016-02-06 MED FILL — MELOXICAM 15 MG TABLET: 15 | 30 days supply | Qty: 30 | Fill #0

## 2016-02-09 DIAGNOSIS — K219 Gastro-esophageal reflux disease without esophagitis: Secondary | ICD-10-CM | POA: Diagnosis not present

## 2016-02-09 DIAGNOSIS — R1013 Epigastric pain: Secondary | ICD-10-CM | POA: Diagnosis not present

## 2016-02-09 DIAGNOSIS — K828 Other specified diseases of gallbladder: Secondary | ICD-10-CM | POA: Diagnosis not present

## 2016-02-16 MED FILL — FLUTICASONE PROP 50 MCG SPR: 50 | 30 days supply | Qty: 16 | Fill #4

## 2016-02-26 DIAGNOSIS — M25611 Stiffness of right shoulder, not elsewhere classified: Secondary | ICD-10-CM | POA: Diagnosis not present

## 2016-02-26 DIAGNOSIS — M25511 Pain in right shoulder: Secondary | ICD-10-CM | POA: Diagnosis not present

## 2016-02-26 DIAGNOSIS — R531 Weakness: Secondary | ICD-10-CM | POA: Diagnosis not present

## 2016-02-26 DIAGNOSIS — M7581 Other shoulder lesions, right shoulder: Secondary | ICD-10-CM | POA: Diagnosis not present

## 2016-02-27 MED FILL — ESTRACE 0.01% CREAM: 0.1 | 90 days supply | Qty: 43 | Fill #1

## 2016-02-28 ENCOUNTER — Other Ambulatory Visit: Payer: Self-pay | Admitting: Surgery

## 2016-02-28 DIAGNOSIS — K389 Disease of appendix, unspecified: Secondary | ICD-10-CM | POA: Diagnosis not present

## 2016-02-28 DIAGNOSIS — K828 Other specified diseases of gallbladder: Secondary | ICD-10-CM | POA: Diagnosis not present

## 2016-02-29 ENCOUNTER — Encounter: Payer: 59 | Attending: Physician Assistant | Admitting: Skilled Nursing Facility1

## 2016-02-29 ENCOUNTER — Encounter: Payer: Self-pay | Admitting: Skilled Nursing Facility1

## 2016-02-29 DIAGNOSIS — Z713 Dietary counseling and surveillance: Secondary | ICD-10-CM | POA: Insufficient documentation

## 2016-02-29 NOTE — Patient Instructions (Signed)
-  Try to use the steps a little more often or for a longer period of time at once  -Peas, corn, green beans, carrots, potatoes (with the skin)  -Try mashed cauliflower (maybe try shredded soy cheese)  -Beans, Beans, Beans   -Brown rice  -Microwave brown rice, a can of carrots, a can of beans all in the microwave your good to go (get the low sodium stuff and through the carrots and beans in a colander and rinse them)   Get a bag of the baby broccoli blend (frozen) cook that in the microwave and then add olive oil  And Parmesan to it and mix it up  -Find the riced vegetables in the frozen aisle cook those in a frying pan with teriaky and edameme   -Try a smoothie: Jiff or PB2 powder peanut butter, frozen fruit, unsweetened soy milk, spinach or carrots  -Try stewed beans with tomato and onion

## 2016-02-29 NOTE — Progress Notes (Signed)
  Medical Nutrition Therapy:  Appt start time: 9:12 end time:  10:00  Assessment:  Primary concerns today: employee. Pt states she is having her gallbladder removed. Pt states she does not like vegetables. Pt states she has a fatty liver. 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   Preferred Learning Style:   No preference indicated   Learning Readiness:   Contemplating  MEDICATIONS: See List   DIETARY INTAKE:  Usual eating pattern includes 3 meals and 2 snacks per day.  Everyday foods include yogurt.  Avoided foods include none stated.    24-hr recall:  B ( AM): protein shake Snk ( AM): fruit, fiber one fiber, yogurt L ( PM): protein bars----grilled chicken from [employer] Snk ( PM): fruit, fiber one cereal D ( PM): yogurt, hamburger, peas, green beans Snk ( PM): low fat ice cream Beverages:80-100 ounces of water  Usual physical activity: 30 minutes of resistance, using stairs   Progress Towards Goal(s):  In progress.    Intervention:  Nutrition counseling for low fat. Dietitian educated the pt on low fat foods and some strategies for eating more vegetables. Goals: -Try to use the steps a little more often or for a longer period of time at once  -Peas, corn, green beans, carrots, potatoes (with the skin)  -Try mashed cauliflower (maybe try shredded soy cheese)  -Beans, Beans, Beans   -Brown rice  -Microwave brown rice, a can of carrots, a can of beans all in the microwave your good to go (get the low sodium stuff and through the carrots and beans in a colander and rinse them)   Get a bag of the baby broccoli blend (frozen) cook that in the microwave and then add olive oil  And Parmesan to it and mix it up  -Find the riced vegetables in the frozen aisle cook those in a frying pan with teriaky and edameme   -Try a smoothie: Jiff or PB2 powder peanut butter, frozen fruit, unsweetened soy milk, spinach or carrots  -Try  stewed beans with tomato and onion  Barriers to learning/adherence to lifestyle change: dislike of non-starchy vegetables  Demonstrated degree of understanding via:  Teach Back   Monitoring/Evaluation:  Dietary intake, exercise, and body weight prn.

## 2016-03-04 DIAGNOSIS — M7581 Other shoulder lesions, right shoulder: Secondary | ICD-10-CM | POA: Diagnosis not present

## 2016-03-04 DIAGNOSIS — M25611 Stiffness of right shoulder, not elsewhere classified: Secondary | ICD-10-CM | POA: Diagnosis not present

## 2016-03-04 DIAGNOSIS — M25511 Pain in right shoulder: Secondary | ICD-10-CM | POA: Diagnosis not present

## 2016-03-04 DIAGNOSIS — R531 Weakness: Secondary | ICD-10-CM | POA: Diagnosis not present

## 2016-03-07 DIAGNOSIS — R05 Cough: Secondary | ICD-10-CM | POA: Diagnosis not present

## 2016-03-11 ENCOUNTER — Encounter (HOSPITAL_COMMUNITY)
Admission: RE | Admit: 2016-03-11 | Discharge: 2016-03-11 | Disposition: A | Discharge status: Home / Self Care | Payer: 59 | Source: Ambulatory Visit | Attending: Surgery | Admitting: Surgery

## 2016-03-11 ENCOUNTER — Encounter (HOSPITAL_COMMUNITY): Payer: Self-pay

## 2016-03-11 DIAGNOSIS — K381 Appendicular concretions: Secondary | ICD-10-CM | POA: Diagnosis not present

## 2016-03-11 DIAGNOSIS — K811 Chronic cholecystitis: Secondary | ICD-10-CM | POA: Diagnosis not present

## 2016-03-11 DIAGNOSIS — Z888 Allergy status to other drugs, medicaments and biological substances status: Secondary | ICD-10-CM | POA: Diagnosis not present

## 2016-03-11 DIAGNOSIS — K388 Other specified diseases of appendix: Secondary | ICD-10-CM | POA: Diagnosis not present

## 2016-03-11 DIAGNOSIS — E7800 Pure hypercholesterolemia, unspecified: Secondary | ICD-10-CM | POA: Diagnosis not present

## 2016-03-11 DIAGNOSIS — D135 Benign neoplasm of extrahepatic bile ducts: Secondary | ICD-10-CM | POA: Diagnosis not present

## 2016-03-11 DIAGNOSIS — K828 Other specified diseases of gallbladder: Secondary | ICD-10-CM | POA: Diagnosis not present

## 2016-03-11 DIAGNOSIS — E039 Hypothyroidism, unspecified: Secondary | ICD-10-CM | POA: Diagnosis not present

## 2016-03-11 HISTORY — DX: Other seasonal allergic rhinitis: J30.2

## 2016-03-11 HISTORY — DX: Personal history of other diseases of the respiratory system: Z87.09

## 2016-03-11 HISTORY — DX: Cough: R05

## 2016-03-11 HISTORY — DX: Cough, unspecified: R05.9

## 2016-03-11 LAB — CBC
HCT: 42.2 % (ref 36.0–46.0)
Hemoglobin: 14.2 g/dL (ref 12.0–15.0)
MCH: 30.5 pg (ref 26.0–34.0)
MCHC: 33.6 g/dL (ref 30.0–36.0)
MCV: 90.8 fL (ref 78.0–100.0)
PLATELETS: 270 10*3/uL (ref 150–400)
RBC: 4.65 MIL/uL (ref 3.87–5.11)
RDW: 13.4 % (ref 11.5–15.5)
WBC: 11.2 10*3/uL — ABNORMAL HIGH (ref 4.0–10.5)

## 2016-03-11 LAB — BASIC METABOLIC PANEL
Anion gap: 7 (ref 5–15)
BUN: 19 mg/dL (ref 6–20)
CALCIUM: 9.4 mg/dL (ref 8.9–10.3)
CO2: 27 mmol/L (ref 22–32)
CREATININE: 0.7 mg/dL (ref 0.44–1.00)
Chloride: 106 mmol/L (ref 101–111)
GFR calc non Af Amer: >60 mL/min (ref >60)
Glucose, Bld: 88 mg/dL (ref 65–99)
Potassium: 4.4 mmol/L (ref 3.5–5.1)
SODIUM: 140 mmol/L (ref 135–145)

## 2016-03-11 NOTE — Patient Instructions (Signed)
DAIVA SINER  03/11/2016   Your procedure is scheduled on: Thursday March 14, 2016  Report to [facility] Main  Entrance take [place]  elevators to 3rd floor to  [place] at 7:00 AM.  Call this number if you have problems the morning of surgery [phone]   Remember: ONLY 1 PERSON MAY GO WITH YOU TO SHORT STAY TO GET  READY MORNING OF [place].  Do not eat food or drink liquids :After Midnight.     Take these medicines the morning of surgery with A SIP OF WATER: Levothyroxine                                 You may not have any metal on your body including hair pins and              piercings  Do not wear jewelry, make-up, lotions, powders or perfumes, deodorant             Do not wear nail polish.  Do not shave  48 hours prior to surgery.     Do not bring valuables to the hospital. [place].  Contacts, dentures or bridgework may not be worn into surgery.  Leave suitcase in the car. After surgery it may be brought to your room.   _____________________________________________________________________             [facility] - Preparing for Surgery Before surgery, you can play an important role.  Because skin is not sterile, your skin needs to be as free of germs as possible.  You can reduce the number of germs on your skin by washing with CHG (chlorahexidine gluconate) soap before surgery.  CHG is an antiseptic cleaner which kills germs and bonds with the skin to continue killing germs even after washing. Please DO NOT use if you have an allergy to CHG or antibacterial soaps.  If your skin becomes reddened/irritated stop using the CHG and inform your nurse when you arrive at Short Stay. Do not shave (including legs and underarms) for at least 48 hours prior to the first CHG shower.  You may shave your face/neck. Please follow these instructions carefully:  1.  Shower with CHG Soap the night  before surgery and the  morning of Surgery.  2.  If you choose to wash your hair, wash your hair first as usual with your  normal  shampoo.  3.  After you shampoo, rinse your hair and body thoroughly to remove the  shampoo.                           4.  Use CHG as you would any other liquid soap.  You can apply chg directly  to the skin and wash                       Gently with a scrungie or clean washcloth.  5.  Apply the CHG Soap to your body ONLY FROM THE NECK DOWN.   Do not use on face/ open  Wound or open sores. Avoid contact with eyes, ears mouth and genitals (private parts).                       Wash face,  Genitals (private parts) with your normal soap.             6.  Wash thoroughly, paying special attention to the area where your surgery  will be performed.  7.  Thoroughly rinse your body with warm water from the neck down.  8.  DO NOT shower/wash with your normal soap after using and rinsing off  the CHG Soap.                9.  Pat yourself dry with a clean towel.            10.  Wear clean pajamas.            11.  Place clean sheets on your bed the night of your first shower and do not  sleep with pets. Day of Surgery : Do not apply any lotions/deodorants the morning of surgery.  Please wear clean clothes to the hospital/surgery center.  FAILURE TO FOLLOW THESE INSTRUCTIONS MAY RESULT IN THE CANCELLATION OF YOUR SURGERY PATIENT SIGNATURE_________________________________  NURSE SIGNATURE__________________________________  ________________________________________________________________________

## 2016-03-13 NOTE — H&P (Signed)
Ashley Burke 02/28/2016 3:39 PM Location: [facility] Patient #: [id number] DOB: 1960/10/17 Married / Language: English / Race: White Female   History of Present Illness (Kinnick Maus A. Ninfa Linden MD; 02/28/2016 4:20 PM) Patient words: New-Gallbladder.  The patient is a 55 year old female who presents with abdominal pain. This is a pleasant female referred to me by Dr. Juanita Craver for right upper quadrant abdominal pain and biliary dyskinesia. For several months, she's been having epigastric abdominal pain with pain to the right upper quadrant and occasional nausea and vomiting after fatty meals. She has she had a severe episode back in May and had a CAT scan of the abdomen and pelvis performed at that time. She probably had an ileus but incidentally she had an appendicolith at the base of the appendix which was visualized. There was no findings consistent with acute appendicitis. She denies any right lower quadrant abdominal pain. She reports that her epigastric pain is moderate in intensity. It is occurring almost daily. Bowel movements of the normal. She denies fevers or chills. She has had a normal ultrasound of her gallbladder. She has a HIDA scan showing her to have an 11% gallbladder ejection fraction   Other Problems Malachi Bonds, CMA; 02/28/2016 3:39 PM) Hypercholesterolemia Thyroid Disease  Past Surgical History Malachi Bonds, CMA; 02/28/2016 3:39 PM) Knee Surgery Left. Tonsillectomy  Diagnostic Studies History Malachi Bonds, CMA; 02/28/2016 3:39 PM) Colonoscopy within last year Mammogram within last year Pap Smear 1-5 years ago  Allergies Malachi Bonds, CMA; 02/28/2016 3:41 PM) AmLODIPine Besylate *Calcium Channel Blockers  Medication History (Malachi Bonds, CMA; 02/28/2016 3:41 PM) Levothyroxine Sodium (50MCG Tablet, Oral) Active. Rosuvastatin Calcium (20MG  Tablet, Oral) Active. Multivitamin Adult (Oral) Active. MiraLax (Oral)  Active. Colace (100MG  Capsule, Oral) Active. Medications Reconciled  Social History Malachi Bonds, CMA; 02/28/2016 3:39 PM) Alcohol use Remotely quit alcohol use. No caffeine use No drug use Tobacco use Never smoker.  Family History Malachi Bonds, CMA; 02/28/2016 3:39 PM) Cancer Father, Mother. Diabetes Mellitus Father, Sister.  Pregnancy / Birth History Malachi Bonds, CMA; 02/28/2016 3:39 PM) Age at menarche 11 years. Age of menopause 65-59 Gravida 0 Para 0    Review of Systems Malachi Bonds CMA; 02/28/2016 3:39 PM) General Present- Fatigue and Weight Gain. Not Present- Appetite Loss, Chills, Fever, Night Sweats and Weight Loss. Skin Present- Dryness. Not Present- Change in Wart/Mole, Hives, Jaundice, New Lesions, Non-Healing Wounds, Rash and Ulcer. HEENT Present- Sore Throat. Not Present- Earache, Hearing Loss, Hoarseness, Nose Bleed, Oral Ulcers, Ringing in the Ears, Seasonal Allergies, Sinus Pain, Visual Disturbances, Wears glasses/contact lenses and Yellow Eyes. Respiratory Not Present- Bloody sputum, Chronic Cough, Difficulty Breathing, Snoring and Wheezing. Breast Not Present- Breast Mass, Breast Pain, Nipple Discharge and Skin Changes. Cardiovascular Not Present- Chest Pain, Difficulty Breathing Lying Down, Leg Cramps, Palpitations, Rapid Heart Rate, Shortness of Breath and Swelling of Extremities. Gastrointestinal Present- Abdominal Pain, Bloating, Constipation, Gets full quickly at meals and Nausea. Not Present- Bloody Stool, Change in Bowel Habits, Chronic diarrhea, Difficulty Swallowing, Excessive gas, Hemorrhoids, Indigestion, Rectal Pain and Vomiting. Female Genitourinary Not Present- Frequency, Nocturia, Painful Urination, Pelvic Pain and Urgency. Musculoskeletal Present- Joint Pain and Joint Stiffness. Not Present- Back Pain, Muscle Pain, Muscle Weakness and Swelling of Extremities. Neurological Not Present- Decreased Memory, Fainting, Headaches,  Numbness, Seizures, Tingling, Tremor, Trouble walking and Weakness. Psychiatric Not Present- Anxiety, Bipolar, Change in Sleep Pattern, Depression, Fearful and Frequent crying. Endocrine Present- Cold Intolerance and Hot flashes. Not Present- Excessive Hunger, Hair Changes, Heat  Intolerance and New Diabetes. Hematology Not Present- Blood Thinners, Easy Bruising, Excessive bleeding, Gland problems, HIV and Persistent Infections.  Vitals (Chemira Jones CMA; 02/28/2016 3:40 PM) 02/28/2016 3:40 PM Weight: 145.8 lb Height: 68in Body Surface Area: 1.79 m Body Mass Index: 22.17 kg/m  Temp.: 98.71F(Oral)  Pulse: 64 (Regular)  BP: 112/68 (Sitting, Left Arm, Standard)       Physical Exam (Jniya Madara A. Ninfa Linden MD; 02/28/2016 4:19 PM) General Mental Status-Alert. General Appearance-Consistent with stated age. Hydration-Well hydrated. Voice-Normal.  Head and Neck Head-normocephalic, atraumatic with no lesions or palpable masses.  Eye Eyeball - Bilateral-Extraocular movements intact. Sclera/Conjunctiva - Bilateral-No scleral icterus.  Chest and Lung Exam Chest and lung exam reveals -quiet, even and easy respiratory effort with no use of accessory muscles and on auscultation, normal breath sounds, no adventitious sounds and normal vocal resonance. Inspection Chest Wall - Normal. Back - normal.  Cardiovascular Cardiovascular examination reveals -on palpation PMI is normal in location and amplitude, no palpable S3 or S4. Normal cardiac borders., normal heart sounds, regular rate and rhythm with no murmurs, carotid auscultation reveals no bruits and normal pedal pulses bilaterally.  Abdomen Inspection Inspection of the abdomen reveals - No Hernias. Skin - Scar - no surgical scars. Palpation/Percussion Palpation and Percussion of the abdomen reveal - Soft, No Rebound tenderness, No Rigidity (guarding) and No hepatosplenomegaly. Tenderness - Right Upper  Quadrant. Note: There is mild tenderness in the right upper quadrant but none in the right lower quadrant. Auscultation Auscultation of the abdomen reveals - Bowel sounds normal.  Neurologic - Did not examine.  Musculoskeletal - Did not examine.    Assessment & Plan (Novella Abraha A. Ninfa Linden MD; 02/28/2016 4:21 PM) BILIARY DYSKINESIA (K82.8) Impression: I discussed the findings of biliary dyskinesia as well as the appendicolith with her in detail. Regarding her gallbladder, I am recommending a laparoscopic cholecystectomy. Regarding the appendicolith, I explained that this was an incidental finding but she does have a real chance of getting appendicitis in her lifetime which I feel is probably around 10%. I discussed appendectomy with her. I explained that this was her decision as we would already be performing laparoscopic surgery on her. I believe it is reasonable to remove the appendix at that time to prevent appendicitis in the future given the appendicolith. I discussed both procedures with her in detail. I discussed the risks of surgery. These risks include but are not limited to bleeding, infection, bile duct injury, bile leak, appendiceal stump leak, need for further surgery, conversion to an open procedure, postoperative recovery, etc. She understands and wished to proceed with both cholecystectomy and appendectomy APPENDICOLITH (K38.9)

## 2016-03-14 ENCOUNTER — Encounter (HOSPITAL_COMMUNITY): Payer: Self-pay | Admitting: *Deleted

## 2016-03-14 ENCOUNTER — Ambulatory Visit (HOSPITAL_COMMUNITY): Payer: 59 | Admitting: Registered Nurse

## 2016-03-14 ENCOUNTER — Observation Stay (HOSPITAL_COMMUNITY)
Admission: RE | Admit: 2016-03-14 | Discharge: 2016-03-14 | Disposition: A | Discharge status: Home / Self Care | Payer: 59 | Source: Ambulatory Visit | Attending: Surgery | Admitting: Surgery

## 2016-03-14 ENCOUNTER — Encounter (HOSPITAL_COMMUNITY)
Admission: RE | Disposition: A | Discharge status: Home / Self Care | Payer: Self-pay | Source: Ambulatory Visit | Attending: Surgery

## 2016-03-14 DIAGNOSIS — Z888 Allergy status to other drugs, medicaments and biological substances status: Secondary | ICD-10-CM | POA: Diagnosis not present

## 2016-03-14 DIAGNOSIS — D135 Benign neoplasm of extrahepatic bile ducts: Secondary | ICD-10-CM | POA: Insufficient documentation

## 2016-03-14 DIAGNOSIS — E039 Hypothyroidism, unspecified: Secondary | ICD-10-CM | POA: Insufficient documentation

## 2016-03-14 DIAGNOSIS — E7800 Pure hypercholesterolemia, unspecified: Secondary | ICD-10-CM | POA: Insufficient documentation

## 2016-03-14 DIAGNOSIS — K381 Appendicular concretions: Secondary | ICD-10-CM | POA: Insufficient documentation

## 2016-03-14 DIAGNOSIS — J329 Chronic sinusitis, unspecified: Secondary | ICD-10-CM | POA: Diagnosis not present

## 2016-03-14 DIAGNOSIS — K828 Other specified diseases of gallbladder: Secondary | ICD-10-CM | POA: Diagnosis not present

## 2016-03-14 DIAGNOSIS — K388 Other specified diseases of appendix: Secondary | ICD-10-CM | POA: Insufficient documentation

## 2016-03-14 DIAGNOSIS — Z9049 Acquired absence of other specified parts of digestive tract: Secondary | ICD-10-CM

## 2016-03-14 DIAGNOSIS — K811 Chronic cholecystitis: Secondary | ICD-10-CM | POA: Insufficient documentation

## 2016-03-14 HISTORY — PX: CHOLECYSTECTOMY: SHX55

## 2016-03-14 HISTORY — PX: LAPAROSCOPIC APPENDECTOMY: SHX408

## 2016-03-14 SURGERY — LAPAROSCOPIC CHOLECYSTECTOMY
Anesthesia: General | Site: Abdomen

## 2016-03-14 MED ORDER — LEVOTHYROXINE SODIUM 50 MCG PO TABS: 50.0000 ug | ORAL_TABLET | Freq: Every day | ORAL | Status: DC

## 2016-03-14 MED ORDER — LACTATED RINGERS IV SOLN: INTRAVENOUS | Status: DC

## 2016-03-14 MED ORDER — MORPHINE SULFATE (PF) 2 MG/ML IV SOLN
1.0000 mg | INTRAVENOUS | Status: DC | PRN
  Administered 2016-03-14 (×2): 2 mg via INTRAVENOUS
  Filled 2016-03-14 (×2): qty 1

## 2016-03-14 MED ORDER — LACTATED RINGERS IV SOLN
INTRAVENOUS | Status: DC
  Administered 2016-03-14 (×2): via INTRAVENOUS

## 2016-03-14 MED ORDER — EPHEDRINE SULFATE-NACL 50-0.9 MG/10ML-% IV SOSY
PREFILLED_SYRINGE | INTRAVENOUS | Status: DC | PRN
  Administered 2016-03-14: 20 mg via INTRAVENOUS

## 2016-03-14 MED ORDER — POTASSIUM CHLORIDE IN NACL 20-0.9 MEQ/L-% IV SOLN
INTRAVENOUS | Status: DC
  Administered 2016-03-14: 12:00:00 via INTRAVENOUS
  Filled 2016-03-14: qty 1000

## 2016-03-14 MED ORDER — CEFAZOLIN SODIUM-DEXTROSE 2-4 GM/100ML-% IV SOLN
2.0000 g | INTRAVENOUS | Status: AC
  Administered 2016-03-14: 2 g via INTRAVENOUS

## 2016-03-14 MED ORDER — CHLORHEXIDINE GLUCONATE CLOTH 2 % EX PADS: 6.0000 | MEDICATED_PAD | Freq: Once | CUTANEOUS | Status: DC

## 2016-03-14 MED ORDER — ENOXAPARIN SODIUM 40 MG/0.4ML ~~LOC~~ SOLN: 40.0000 mg | SUBCUTANEOUS | Status: DC

## 2016-03-14 MED ORDER — DEXAMETHASONE SODIUM PHOSPHATE 10 MG/ML IJ SOLN
INTRAMUSCULAR | Status: DC | PRN
  Administered 2016-03-14: 10 mg via INTRAVENOUS

## 2016-03-14 MED ORDER — LACTATED RINGERS IR SOLN
Status: DC | PRN
  Administered 2016-03-14: 1000 mL

## 2016-03-14 MED ORDER — KETOROLAC TROMETHAMINE 30 MG/ML IJ SOLN
INTRAMUSCULAR | Status: AC
  Filled 2016-03-14: qty 1

## 2016-03-14 MED ORDER — METOCLOPRAMIDE HCL 5 MG/ML IJ SOLN: 10.0000 mg | Freq: Once | INTRAMUSCULAR | Status: DC | PRN

## 2016-03-14 MED ORDER — FENTANYL CITRATE (PF) 100 MCG/2ML IJ SOLN
INTRAMUSCULAR | Status: DC | PRN
Start: 2016-03-14 — End: 2016-03-14
  Administered 2016-03-14: 100 ug via INTRAVENOUS
  Administered 2016-03-14: 50 ug via INTRAVENOUS

## 2016-03-14 MED ORDER — ROCURONIUM BROMIDE 10 MG/ML (PF) SYRINGE
PREFILLED_SYRINGE | INTRAVENOUS | Status: DC | PRN
  Administered 2016-03-14: 10 mg via INTRAVENOUS
  Administered 2016-03-14: 30 mg via INTRAVENOUS

## 2016-03-14 MED ORDER — FENTANYL CITRATE (PF) 100 MCG/2ML IJ SOLN
INTRAMUSCULAR | Status: AC
  Filled 2016-03-14: qty 2

## 2016-03-14 MED ORDER — SUGAMMADEX SODIUM 200 MG/2ML IV SOLN
INTRAVENOUS | Status: AC
  Filled 2016-03-14: qty 2

## 2016-03-14 MED ORDER — KETOROLAC TROMETHAMINE 30 MG/ML IJ SOLN
INTRAMUSCULAR | Status: DC | PRN
  Administered 2016-03-14: 30 mg via INTRAVENOUS

## 2016-03-14 MED ORDER — MIDAZOLAM HCL 2 MG/2ML IJ SOLN
INTRAMUSCULAR | Status: AC
  Filled 2016-03-14: qty 2

## 2016-03-14 MED ORDER — DIPHENHYDRAMINE HCL 25 MG PO CAPS: 25.0000 mg | ORAL_CAPSULE | Freq: Four times a day (QID) | ORAL | Status: DC | PRN

## 2016-03-14 MED ORDER — BUPIVACAINE HCL (PF) 0.5 % IJ SOLN
INTRAMUSCULAR | Status: AC
  Filled 2016-03-14: qty 30

## 2016-03-14 MED ORDER — EPHEDRINE 5 MG/ML INJ
INTRAVENOUS | Status: AC
  Filled 2016-03-14: qty 10

## 2016-03-14 MED ORDER — PROPOFOL 10 MG/ML IV BOLUS
INTRAVENOUS | Status: DC | PRN
  Administered 2016-03-14: 160 mg via INTRAVENOUS

## 2016-03-14 MED ORDER — OXYCODONE-ACETAMINOPHEN 5-325 MG PO TABS
1.0000 | ORAL_TABLET | ORAL | 0 refills | Status: AC | PRN
Start: 2016-03-14 — End: ?

## 2016-03-14 MED ORDER — SUGAMMADEX SODIUM 200 MG/2ML IV SOLN
INTRAVENOUS | Status: DC | PRN
  Administered 2016-03-14: 200 mg via INTRAVENOUS

## 2016-03-14 MED ORDER — DEXAMETHASONE SODIUM PHOSPHATE 10 MG/ML IJ SOLN
INTRAMUSCULAR | Status: AC
  Filled 2016-03-14: qty 1

## 2016-03-14 MED ORDER — CEFAZOLIN SODIUM-DEXTROSE 2-4 GM/100ML-% IV SOLN
INTRAVENOUS | Status: AC
  Filled 2016-03-14: qty 100

## 2016-03-14 MED ORDER — ONDANSETRON HCL 4 MG/2ML IJ SOLN
INTRAMUSCULAR | Status: DC | PRN
  Administered 2016-03-14: 4 mg via INTRAVENOUS

## 2016-03-14 MED ORDER — BUPIVACAINE HCL (PF) 0.5 % IJ SOLN
INTRAMUSCULAR | Status: DC | PRN
  Administered 2016-03-14: 20 mL

## 2016-03-14 MED ORDER — ONDANSETRON 4 MG PO TBDP: 4.0000 mg | ORAL_TABLET | Freq: Four times a day (QID) | ORAL | Status: DC | PRN

## 2016-03-14 MED ORDER — IBUPROFEN 200 MG PO TABS: 600.0000 mg | ORAL_TABLET | Freq: Four times a day (QID) | ORAL | Status: DC | PRN

## 2016-03-14 MED ORDER — 0.9 % SODIUM CHLORIDE (POUR BTL) OPTIME
TOPICAL | Status: DC | PRN
  Administered 2016-03-14: 1000 mL

## 2016-03-14 MED ORDER — LIDOCAINE 2% (20 MG/ML) 5 ML SYRINGE
INTRAMUSCULAR | Status: DC | PRN
  Administered 2016-03-14: 100 mg via INTRAVENOUS

## 2016-03-14 MED ORDER — MEPERIDINE HCL 50 MG/ML IJ SOLN: 6.2500 mg | INTRAMUSCULAR | Status: DC | PRN

## 2016-03-14 MED ORDER — DIPHENHYDRAMINE HCL 50 MG/ML IJ SOLN: 25.0000 mg | Freq: Four times a day (QID) | INTRAMUSCULAR | Status: DC | PRN

## 2016-03-14 MED ORDER — PROPOFOL 10 MG/ML IV BOLUS
INTRAVENOUS | Status: AC
  Filled 2016-03-14: qty 20

## 2016-03-14 MED ORDER — FENTANYL CITRATE (PF) 100 MCG/2ML IJ SOLN
25.0000 ug | INTRAMUSCULAR | Status: DC | PRN
  Administered 2016-03-14 (×3): 50 ug via INTRAVENOUS

## 2016-03-14 MED ORDER — OXYCODONE HCL 5 MG PO TABS: 5.0000 mg | ORAL_TABLET | ORAL | Status: DC | PRN

## 2016-03-14 MED ORDER — MIDAZOLAM HCL 5 MG/5ML IJ SOLN
INTRAMUSCULAR | Status: DC | PRN
  Administered 2016-03-14: 2 mg via INTRAVENOUS

## 2016-03-14 MED ORDER — FENTANYL CITRATE (PF) 250 MCG/5ML IJ SOLN
INTRAMUSCULAR | Status: AC
  Filled 2016-03-14: qty 5

## 2016-03-14 MED ORDER — ONDANSETRON HCL 4 MG/2ML IJ SOLN
INTRAMUSCULAR | Status: AC
Start: 2016-03-14 — End: 2016-03-14
  Filled 2016-03-14: qty 2

## 2016-03-14 MED ORDER — ONDANSETRON HCL 4 MG/2ML IJ SOLN: 4.0000 mg | Freq: Four times a day (QID) | INTRAMUSCULAR | Status: DC | PRN

## 2016-03-14 SURGICAL SUPPLY — 40 items
ADH SKN CLS APL DERMABOND .7 (GAUZE/BANDAGES/DRESSINGS) ×1
APPLIER CLIP 5 13 M/L LIGAMAX5 (MISCELLANEOUS) ×3
APR CLP MED LRG 5 ANG JAW (MISCELLANEOUS) ×1
BAG SPEC RTRVL LRG 6X4 10 (ENDOMECHANICALS) ×2
CABLE HIGH FREQUENCY MONO STRZ (ELECTRODE) ×3 IMPLANT
CHLORAPREP W/TINT 26ML (MISCELLANEOUS) ×3 IMPLANT
CLIP APPLIE 5 13 M/L LIGAMAX5 (MISCELLANEOUS) ×1 IMPLANT
COVER MAYO STAND STRL (DRAPES) IMPLANT
COVER SURGICAL LIGHT HANDLE (MISCELLANEOUS) ×3 IMPLANT
CUTTER FLEX LINEAR 45M (STAPLE) ×2 IMPLANT
DECANTER SPIKE VIAL GLASS SM (MISCELLANEOUS) ×1 IMPLANT
DERMABOND ADVANCED (GAUZE/BANDAGES/DRESSINGS) ×2
DERMABOND ADVANCED .7 DNX12 (GAUZE/BANDAGES/DRESSINGS) IMPLANT
DRAPE C-ARM 42X120 X-RAY (DRAPES) IMPLANT
DRAPE LAPAROSCOPIC ABDOMINAL (DRAPES) ×1 IMPLANT
ELECT REM PT RETURN 9FT ADLT (ELECTROSURGICAL) ×3
ELECTRODE REM PT RTRN 9FT ADLT (ELECTROSURGICAL) ×1 IMPLANT
GLOVE SURG SIGNA 7.5 PF LTX (GLOVE) ×6 IMPLANT
GOWN STRL REUS W/TWL XL LVL3 (GOWN DISPOSABLE) ×8 IMPLANT
HEMOSTAT SURGICEL 4X8 (HEMOSTASIS) IMPLANT
IRRIG SUCT STRYKERFLOW 2 WTIP (MISCELLANEOUS) ×3
IRRIGATION SUCT STRKRFLW 2 WTP (MISCELLANEOUS) ×1 IMPLANT
KIT BASIN OR (CUSTOM PROCEDURE TRAY) ×3 IMPLANT
POUCH SPECIMEN RETRIEVAL 10MM (ENDOMECHANICALS) ×5 IMPLANT
RELOAD 45 VASCULAR/THIN (ENDOMECHANICALS) IMPLANT
RELOAD STAPLE 45 2.5 WHT GRN (ENDOMECHANICALS) IMPLANT
RELOAD STAPLE 45 3.5 BLU ETS (ENDOMECHANICALS) IMPLANT
RELOAD STAPLE TA45 3.5 REG BLU (ENDOMECHANICALS) ×3 IMPLANT
SCISSORS LAP 5X35 DISP (ENDOMECHANICALS) ×3 IMPLANT
SET CHOLANGIOGRAPH MIX (MISCELLANEOUS) IMPLANT
SHEARS HARMONIC ACE PLUS 36CM (ENDOMECHANICALS) ×2 IMPLANT
SLEEVE XCEL OPT CAN 5 100 (ENDOMECHANICALS) ×8 IMPLANT
SUT MNCRL AB 4-0 PS2 18 (SUTURE) ×3 IMPLANT
TOWEL OR 17X26 10 PK STRL BLUE (TOWEL DISPOSABLE) ×3 IMPLANT
TOWEL OR NON WOVEN STRL DISP B (DISPOSABLE) ×3 IMPLANT
TRAY FOLEY W/METER SILVER 16FR (SET/KITS/TRAYS/PACK) ×1 IMPLANT
TRAY LAPAROSCOPIC (CUSTOM PROCEDURE TRAY) ×3 IMPLANT
TROCAR BLADELESS OPT 5 100 (ENDOMECHANICALS) ×3 IMPLANT
TROCAR XCEL BLUNT TIP 100MML (ENDOMECHANICALS) ×3 IMPLANT
TUBING INSUF HEATED (TUBING) ×3 IMPLANT

## 2016-03-14 NOTE — Discharge Summary (Signed)
Physician Discharge Summary  Patient ID: Ashley Burke MRN: [id number] DOB/AGE: 19-Jun-1960 55 y.o.  Admit date: 03/14/2016 Discharge date: 03/14/2016  Admission Diagnoses:  Discharge Diagnoses:  Active Problems:   S/P laparoscopic cholecystectomy   Discharged Condition: good  Hospital Course: uneventful post op recovery  Consults: None  Significant Diagnostic Studies:  Treatments: surgery: lap chole, lap appy  Discharge Exam: Blood pressure 124/64, pulse 63, temperature 98 F (36.7 C), temperature source Oral, resp. rate 15, height 5\' 8"  (1.727 m), weight 65.8 kg (145 lb), last menstrual period 05/22/2012, SpO2 100 %. General appearance: alert, cooperative and no distress Resp: clear to auscultation bilaterally Cardio: regular rate and rhythm, S1, S2 normal, no murmur, click, rub or gallop Incision/Wound:abdomen soft, incisions clean  Disposition: 01-Home or Self Care     Medication List    TAKE these medications   acetaminophen 500 MG tablet Commonly known as:  TYLENOL Take 500-1,000 mg by mouth every 6 (six) hours as needed (for pain/headache.).   ADVIL COLD/SINUS 30-200 MG Tabs Generic drug:  Pseudoephedrine-Ibuprofen Take 1 tablet by mouth daily as needed (for sinus issue).   COQ10 PO Take 1,000 mg by mouth every evening. With Crestor   docusate sodium 100 MG capsule Commonly known as:  COLACE Take 100 mg by mouth 2 (two) times daily.   ESTRACE VAGINAL 0.1 MG/GM vaginal cream Generic drug:  estradiol Place 1 applicator vaginally as directed. Uses 2-3 times a week   ibuprofen 200 MG tablet Commonly known as:  ADVIL,MOTRIN Take 200-400 mg by mouth every 8 (eight) hours as needed (for pain/headaches.).   levothyroxine 50 MCG tablet Commonly known as:  SYNTHROID, LEVOTHROID Take 50 mcg by mouth daily before breakfast.   multivitamin with minerals tablet Take 1 tablet by mouth every morning.   oxyCODONE-acetaminophen 5-325 MG tablet Commonly  known as:  ROXICET Take 1-2 tablets by mouth every 4 (four) hours as needed for severe pain.   polyethylene glycol packet Commonly known as:  MIRALAX / GLYCOLAX Take 17 g by mouth 3 (three) times daily.   RHINOCORT AQUA 32 MCG/ACT nasal spray Generic drug:  budesonide Place 1-2 sprays into both nostrils daily as needed (for seasonal allergies.).   rosuvastatin 20 MG tablet Commonly known as:  CRESTOR Take 20 mg by mouth every evening. With CoQ10        Signed: Malayna Noori A 03/14/2016, 5:06 PM

## 2016-03-14 NOTE — Anesthesia Postprocedure Evaluation (Signed)
Anesthesia Post Note  Patient: Ashley Burke  Procedure(s) Performed: Procedure(s) (LRB): LAPAROSCOPIC CHOLECYSTECTOMY (N/A) LAPAROSCOPIC APPENDECTOMY (N/A)  Patient location during evaluation: PACU Anesthesia Type: General Level of consciousness: awake and alert Pain management: pain level controlled Vital Signs Assessment: post-procedure vital signs reviewed and stable Respiratory status: spontaneous breathing, nonlabored ventilation, respiratory function stable and patient connected to nasal cannula oxygen Cardiovascular status: blood pressure returned to baseline and stable Postop Assessment: no signs of nausea or vomiting Anesthetic complications: no    Last Vitals:  Vitals:   03/14/16 0709  BP: (!) 111/55  Pulse: 64  Resp: 18  Temp: 37 C    Last Pain:  Vitals:   03/14/16 0709  TempSrc: Oral                 [name]

## 2016-03-14 NOTE — Progress Notes (Signed)
Patient alert and oriented with pain controlled. Patient given discharge instructions and prescription. Patient verbalized understanding of discharge instructions. All questions and concerns answered.

## 2016-03-14 NOTE — Addendum Note (Signed)
Addendum  created 03/14/16 [id number] by Talbot Grumbling, CRNA   Anesthesia Intra Meds edited

## 2016-03-14 NOTE — Op Note (Signed)
LAPAROSCOPIC CHOLECYSTECTOMY, LAPAROSCOPIC APPENDECTOMY  Procedure Note  Ashley Burke 03/14/2016   Pre-op Diagnosis: BILIARY DYSKINESIA, APPENDICOLITH     Post-op Diagnosis: same  Procedure(s): LAPAROSCOPIC CHOLECYSTECTOMY LAPAROSCOPIC APPENDECTOMY  Surgeon(s): Coralie Keens, MD  Anesthesia: General  Staff:  Circulator: Barbee Shropshire, RN Relief Circulator: Kipp Laurence, RN Scrub Person: Rockwell Germany, CST; Kriste Basque  Estimated Blood Loss: Minimal               Specimens: sent to path          [facility] A   Date: 03/14/2016  Time: 9:10 AM

## 2016-03-14 NOTE — Transfer of Care (Signed)
Immediate Anesthesia Transfer of Care Note  Patient: Ashley Burke  Procedure(s) Performed: Procedure(s): LAPAROSCOPIC CHOLECYSTECTOMY (N/A) LAPAROSCOPIC APPENDECTOMY (N/A)  Patient Location: PACU  Anesthesia Type:General  Level of Consciousness:  sedated, patient cooperative and responds to stimulation  Airway & Oxygen Therapy:Patient Spontanous Breathing and Patient connected to face mask oxgen  Post-op Assessment:  Report given to PACU RN and Post -op Vital signs reviewed and stable  Post vital signs:  Reviewed and stable  Last Vitals:  Vitals:   03/14/16 0709  BP: (!) 111/55  Pulse: 64  Resp: 18  Temp: 37 C    Complications: No apparent anesthesia complications

## 2016-03-14 NOTE — Anesthesia Procedure Notes (Signed)
Procedure Name: Intubation Date/Time: 03/14/2016 8:31 AM Performed by: Talbot Grumbling Pre-anesthesia Checklist: Patient identified, Emergency Drugs available, Suction available and Patient being monitored Patient Re-evaluated:Patient Re-evaluated prior to inductionOxygen Delivery Method: Circle system utilized Preoxygenation: Pre-oxygenation with 100% oxygen Intubation Type: IV induction Ventilation: Mask ventilation without difficulty Laryngoscope Size: Mac and 3 Grade View: Grade I Tube type: Oral Tube size: 7.0 mm Number of attempts: 1 Airway Equipment and Method: Stylet Placement Confirmation: ETT inserted through vocal cords under direct vision,  positive ETCO2 and breath sounds checked- equal and bilateral Secured at: 21 cm Tube secured with: Tape Dental Injury: Teeth and Oropharynx as per pre-operative assessment

## 2016-03-14 NOTE — Progress Notes (Signed)
Patient ID: Ashley Burke, female   DOB: 08-15-1960, 55 y.o.   MRN: [id number]  Doing well post op Ambulating well Minimal pain Abdomen soft  Plan: Discharge home

## 2016-03-14 NOTE — Interval H&P Note (Signed)
History and Physical Interval Note: no change in H and P  03/14/2016 7:55 AM  Ashley Burke  has presented today for surgery, with the diagnosis of BILIARY DYSKINESIA, APPENDICOLITH  The various methods of treatment have been discussed with the patient and family. After consideration of risks, benefits and other options for treatment, the patient has consented to  Procedure(s): LAPAROSCOPIC CHOLECYSTECTOMY (N/A) LAPAROSCOPIC APPENDECTOMY (N/A) as a surgical intervention .  The patient's history has been reviewed, patient examined, no change in status, stable for surgery.  I have reviewed the patient's chart and labs.  Questions were answered to the patient's satisfaction.     Ashley Burke A

## 2016-03-14 NOTE — Anesthesia Preprocedure Evaluation (Signed)
Anesthesia Evaluation  Patient identified by MRN, date of birth, ID band Patient awake    Reviewed: Allergy & Precautions, NPO status , Patient's Chart, lab work & pertinent test results  Airway Mallampati: II  TM Distance: >3 FB Neck ROM: Full    Dental no notable dental hx. (+) Caps   Pulmonary neg pulmonary ROS,    Pulmonary exam normal breath sounds clear to auscultation       Cardiovascular hypertension, Pt. on medications negative cardio ROS Normal cardiovascular exam Rhythm:Regular Rate:Normal     Neuro/Psych negative neurological ROS  negative psych ROS   GI/Hepatic negative GI ROS, Neg liver ROS,   Endo/Other  negative endocrine ROSHypothyroidism   Renal/GU negative Renal ROS  negative genitourinary   Musculoskeletal negative musculoskeletal ROS (+)   Abdominal   Peds negative pediatric ROS (+)  Hematology negative hematology ROS (+)   Anesthesia Other Findings   Reproductive/Obstetrics negative OB ROS                             Anesthesia Physical Anesthesia Plan  ASA: II  Anesthesia Plan: General   Post-op Pain Management:    Induction: Intravenous  Airway Management Planned: Oral ETT  Additional Equipment:   Intra-op Plan:   Post-operative Plan: Extubation in OR  Informed Consent: I have reviewed the patients History and Physical, chart, labs and discussed the procedure including the risks, benefits and alternatives for the proposed anesthesia with the patient or authorized representative who has indicated his/her understanding and acceptance.   Dental advisory given  Plan Discussed with: CRNA  Anesthesia Plan Comments:         Anesthesia Quick Evaluation

## 2016-03-14 NOTE — Discharge Instructions (Signed)
[facility] ______[facility] °LAPAROSCOPIC SURGERY: POST OP INSTRUCTIONS °Always review your discharge instruction sheet given to you by the facility where your surgery was performed. °IF YOU HAVE DISABILITY OR FAMILY LEAVE FORMS, YOU MUST BRING THEM TO THE OFFICE FOR PROCESSING.   °DO NOT GIVE THEM TO YOUR DOCTOR. ° °1. A prescription for pain medication may be given to you upon discharge.  Take your pain medication as prescribed, if needed.  If narcotic pain medicine is not needed, then you may take acetaminophen (Tylenol) or ibuprofen (Advil) as needed. °2. Take your usually prescribed medications unless otherwise directed. °3. If you need a refill on your pain medication, please contact your pharmacy.  They will contact our office to request authorization. Prescriptions will not be filled after 5pm or on week-ends. °4. You should follow a light diet the first few days after arrival home, such as soup and crackers, etc.  Be sure to include lots of fluids daily. °5. Most patients will experience some swelling and bruising in the area of the incisions.  Ice packs will help.  Swelling and bruising can take several days to resolve.  °6. It is common to experience some constipation if taking pain medication after surgery.  Increasing fluid intake and taking a stool softener (such as Colace) will usually help or prevent this problem from occurring.  A mild laxative (Milk of Magnesia or Miralax) should be taken according to package instructions if there are no bowel movements after 48 hours. °7. Unless discharge instructions indicate otherwise, you may remove your bandages 24-48 hours after surgery, and you may shower at that time.  You may have steri-strips (small skin tapes) in place directly over the incision.  These strips should be left on the skin for 7-10 days.  If your surgeon used skin glue on the incision, you may shower in 24 hours.  The glue will flake off over the next 2-3 weeks.  Any sutures or  staples will be removed at the office during your follow-up visit. °8. ACTIVITIES:  You may resume regular (light) daily activities beginning the next day--such as daily self-care, walking, climbing stairs--gradually increasing activities as tolerated.  You may have sexual intercourse when it is comfortable.  Refrain from any heavy lifting or straining until approved by your doctor. °a. You may drive when you are no longer taking prescription pain medication, you can comfortably wear a seatbelt, and you can safely maneuver your car and apply brakes. °b. RETURN TO WORK:  __________________________________________________________ °9. You should see your doctor in the office for a follow-up appointment approximately 2-3 weeks after your surgery.  Make sure that you call for this appointment within a day or two after you arrive home to insure a convenient appointment time. °10. OTHER INSTRUCTIONS: __________________________________________________________________________________________________________________________ __________________________________________________________________________________________________________________________ °WHEN TO CALL YOUR DOCTOR: °1. Fever over 101.0 °2. Inability to urinate °3. Continued bleeding from incision. °4. Increased pain, redness, or drainage from the incision. °5. Increasing abdominal pain ° °The clinic staff is available to answer your questions during regular business hours.  Please don’t hesitate to call and ask to speak to one of the nurses for clinical concerns.  If you have a medical emergency, go to the nearest emergency room or call 911.  A surgeon from [facility] is always on call at the hospital. °[address], [address], [place], [place]  [address] ? [address] [address], , [place]   [address] °[phone] ? [phone] ? [phone] [phone] °Web site:   [email or web address] °

## 2016-03-15 NOTE — Op Note (Signed)
NAMEMARIENA, Ashley Burke            ACCOUNT NO.:  [id number]  MEDICAL RECORD NO.:  [id number]  LOCATION:  [id number]                         FACILITY:  [facility]  PHYSICIAN:  Coralie Keens, M.D. DATE OF BIRTH:  [birth year]  DATE OF PROCEDURE:  03/14/2016 DATE OF DISCHARGE:  03/14/2016                              OPERATIVE REPORT   PREOPERATIVE DIAGNOSES: 1. Biliary dyskinesia. 2. Appendicolith.  POSTOPERATIVE DIAGNOSES: 1. Biliary dyskinesia. 2. Appendicolith.  PROCEDURES: 1. Laparoscopic cholecystectomy. 2. Laparoscopic appendectomy.  SURGEON:  Coralie Keens, M.D.  ANESTHESIA:  General and 0.5% Marcaine.  ESTIMATED BLOOD LOSS:  Minimal.  INDICATIONS:  This is a 55 year old female, who had presented several times with abdominal pain.  Most of this have been epigastric.  She has had a previous CAT scan of the abdomen and pelvis.  She has also had a significant workup of her gallbladder.  She has been found to have biliary dyskinesia.  Incidentally, she has also been found to have an appendicolith in the central portion of the appendix.  After a long discussion, she is agreed to proceed with cholecystectomy and an incidental appendectomy.  We again discussed the possibility of future appendicitis with appendicolith.  Although, this risk is quite small. She has decided to proceed with appendectomy.  FINDINGS:  The patient was found to have a chronically thick-walled appearing gallbladder.  The appendix itself was normal in appearance except for an area of distention in the central portion of the appendix.  PROCEDURE IN DETAIL:  The patient was brought to the operating room, identified as Ashley Burke.  She was placed supine on the operating room table and general anesthesia was induced.  Her abdomen was prepped and draped in the usual sterile fashion.  I made a small vertical incision below the umbilicus.  I carried this down to the fascia, which was then opened with a  scalpel.  A hemostat was used to pass into the peritoneal cavity under direct vision.  A 0 Vicryl pursestring suture was placed around the fascial opening.  The Hasson port was placed through the opening and insufflation of the abdomen was begun.  I placed a 5-mm port in the patient's epigastrium and two more in the right upper quadrant under direct vision.  The gallbladder was found to be thick- walled in appearance and floppy.  I was able to grasp it and retract it above the liver bed.  Dissection was then carried out at the base of the gallbladder.  The cystic duct and cystic artery were identified and dissected free and a critical window was achieved around both.  The cystic duct was clipped three times proximally and once distally, and transected.  The cystic artery was then clipped proximally, distally and transected as well.  A posterior branch of the artery was identified, clipped and transected as well.  The gallbladder was then slowly dissected free from the liver bed with the electrocautery.  Once it was free from liver bed, hemostasis was achieved with the cautery in the liver bed.  The gallbladder was then placed in an Endosac and removed through the incision at the umbilicus.  I next turned my attention toward the appendectomy.  I placed another 5-mm  trocar in the patient's left lower quadrant under direct vision.  The appendix was easily identified coming off at the base of the cecum.  The appendix itself was normal in appearance except for area of distention in the central portion of the appendix.  I took down the mesoappendix with the Harmonic scalpel.  I then transected the base of the appendix with the endoscopic GIA stapler.  The appendix was then placed in an Endosac and removed through the incision at the umbilicus.  I then thoroughly irrigated the abdomen with normal saline.  Hemostasis appeared to be achieved in all areas.  All ports were then removed under direct  vision and the abdomen was deflated.  The 0 Vicryl at the umbilicus was tied in place closing the fascial defect.  All incisions were then anesthetized with Marcaine and closed with 4-0 Monocryl subcuticular sutures.  Skin glue was then applied.  The patient tolerated the procedure well.  All the counts were correct at the end of the procedure.  The patient was then extubated in the operating room and taken in a stable condition to the recovery room.     Coralie Keens, M.D.     DB/MEDQ  D:  03/14/2016  T:  03/15/2016  Job:  [id number]

## 2016-03-25 MED FILL — ROSUVASTATIN CALCIUM 20 MG: 20 | 90 days supply | Qty: 90 | Fill #0

## 2016-03-25 MED FILL — LEVOTHYROXINE 50 MCG TABLET: 50 | 90 days supply | Qty: 135 | Fill #0

## 2016-03-28 MED FILL — FLUTICASONE PROP 50 MCG SPR: 50 | 30 days supply | Qty: 16 | Fill #5

## 2016-04-08 DIAGNOSIS — R21 Rash and other nonspecific skin eruption: Secondary | ICD-10-CM | POA: Diagnosis not present

## 2016-04-08 DIAGNOSIS — L237 Allergic contact dermatitis due to plants, except food: Secondary | ICD-10-CM | POA: Diagnosis not present

## 2016-04-09 DIAGNOSIS — Z760 Encounter for issue of repeat prescription: Secondary | ICD-10-CM | POA: Diagnosis not present

## 2016-04-11 MED FILL — CLOBETASOL 0.05% CREAM: 0.05 | 15 days supply | Qty: 60 | Fill #0

## 2016-04-26 DIAGNOSIS — Z6820 Body mass index (BMI) 20.0-20.9, adult: Secondary | ICD-10-CM | POA: Diagnosis not present

## 2016-04-26 DIAGNOSIS — Z1231 Encounter for screening mammogram for malignant neoplasm of breast: Secondary | ICD-10-CM | POA: Diagnosis not present

## 2016-04-26 DIAGNOSIS — Z01419 Encounter for gynecological examination (general) (routine) without abnormal findings: Secondary | ICD-10-CM | POA: Diagnosis not present

## 2016-04-29 MED FILL — FLUTICASONE PROP 50 MCG SPR: 50 | 30 days supply | Qty: 16 | Fill #6 | Status: TO

## 2016-04-30 ENCOUNTER — Other Ambulatory Visit: Payer: Self-pay | Admitting: Obstetrics and Gynecology

## 2016-04-30 DIAGNOSIS — N959 Unspecified menopausal and perimenopausal disorder: Secondary | ICD-10-CM

## 2016-05-08 ENCOUNTER — Other Ambulatory Visit: Payer: Self-pay | Admitting: Obstetrics and Gynecology

## 2016-05-08 DIAGNOSIS — N951 Menopausal and female climacteric states: Secondary | ICD-10-CM

## 2016-05-09 ENCOUNTER — Ambulatory Visit
Admission: RE | Admit: 2016-05-09 | Discharge: 2016-05-09 | Disposition: A | Discharge status: Home / Self Care | Payer: 59 | Source: Ambulatory Visit | Attending: Obstetrics and Gynecology | Admitting: Obstetrics and Gynecology

## 2016-05-09 DIAGNOSIS — N951 Menopausal and female climacteric states: Secondary | ICD-10-CM

## 2016-05-09 DIAGNOSIS — Z780 Asymptomatic menopausal state: Secondary | ICD-10-CM | POA: Diagnosis not present

## 2016-05-09 DIAGNOSIS — M8588 Other specified disorders of bone density and structure, other site: Secondary | ICD-10-CM | POA: Diagnosis not present

## 2016-06-14 DIAGNOSIS — S335XXA Sprain of ligaments of lumbar spine, initial encounter: Secondary | ICD-10-CM | POA: Diagnosis not present

## 2016-06-14 MED FILL — DICLOFENAC SOD 75 MG TAB EC: 75 | 30 days supply | Qty: 60 | Fill #0

## 2016-06-14 MED FILL — METHOCARBAMOL 500 MG TABLET: 500 | 14 days supply | Qty: 40 | Fill #0

## 2016-06-24 MED FILL — ROSUVASTATIN CALCIUM 20 MG: 20 | 90 days supply | Qty: 90 | Fill #1

## 2016-06-24 MED FILL — LEVOTHYROXINE 50 MCG TABLET: 50 | 90 days supply | Qty: 135 | Fill #1

## 2016-07-11 MED FILL — METHOCARBAMOL 500 MG TABLET: 500 | 14 days supply | Qty: 40 | Fill #1

## 2016-07-11 MED FILL — DICLOFENAC SOD 75 MG TAB EC: 75 | 30 days supply | Qty: 60 | Fill #1

## 2016-07-16 ENCOUNTER — Ambulatory Visit: Payer: 59 | Admitting: Physical Therapy

## 2016-07-22 ENCOUNTER — Ambulatory Visit: Payer: 59

## 2016-07-30 ENCOUNTER — Encounter: Payer: Self-pay | Admitting: Physical Therapy

## 2016-07-30 ENCOUNTER — Ambulatory Visit: Payer: 59 | Attending: Sports Medicine | Admitting: Physical Therapy

## 2016-07-30 DIAGNOSIS — R293 Abnormal posture: Secondary | ICD-10-CM | POA: Diagnosis not present

## 2016-07-30 DIAGNOSIS — M6281 Muscle weakness (generalized): Secondary | ICD-10-CM | POA: Insufficient documentation

## 2016-07-30 DIAGNOSIS — M545 Low back pain: Secondary | ICD-10-CM | POA: Insufficient documentation

## 2016-07-30 NOTE — Patient Instructions (Signed)
PELVIC STABILIZATION: Single Foot Touch    With knees bent over hips and calves parallel to floor, inhale and place toes of right foot on floor. Keep low back pressed into floor by tightening abdominal muscles. Exhaling, bring foot back up. Repeat with other leg. Start with feet on the mat and lifting (progress to starting position as shown in the picture after one week) Repeat __30_ times, alternating legs. Do ___ times per day.  [facility] [address], [place], [place] [address] Phone # [phone] Fax [phone]   Copyright  VHI. All rights reserved.
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
## 2016-08-06 ENCOUNTER — Ambulatory Visit: Payer: 59

## 2016-08-06 DIAGNOSIS — R293 Abnormal posture: Secondary | ICD-10-CM

## 2016-08-06 DIAGNOSIS — M6281 Muscle weakness (generalized): Secondary | ICD-10-CM | POA: Diagnosis not present

## 2016-08-06 DIAGNOSIS — M545 Low back pain: Secondary | ICD-10-CM

## 2016-08-06 NOTE — Patient Instructions (Signed)
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
## 2016-08-12 MED FILL — FLUTICASONE PROP 50 MCG SPR: 50 | 30 days supply | Qty: 16 | Fill #0

## 2016-08-27 ENCOUNTER — Ambulatory Visit: Payer: 59 | Attending: Sports Medicine | Admitting: Physical Therapy

## 2016-08-27 DIAGNOSIS — M6281 Muscle weakness (generalized): Secondary | ICD-10-CM | POA: Insufficient documentation

## 2016-08-27 DIAGNOSIS — M545 Low back pain: Secondary | ICD-10-CM | POA: Diagnosis not present

## 2016-08-27 DIAGNOSIS — R293 Abnormal posture: Secondary | ICD-10-CM | POA: Diagnosis not present

## 2016-08-27 NOTE — Patient Instructions (Addendum)
[employer] yoga you tube video  Walk sideways with green band around thighs and walk sideways 8 feet 4 times.  Bracing With Arms / Legs (Hook-Lying)    With neutral spine, tighten pelvic floor and abdominals and hold. Raise arm and opposite leg, then return. Repeat wtih other limbs. Repeat _20_ times. Do _1__ times a day.   Copyright  VHI. All rights reserved.    Bracing With Bridging (Hook-Lying)    With neutral spine, tighten pelvic floor and abdominals then bring knees apart . Lift bottom. Repeat _15__ times. Do _1__ times a day.   Copyright  VHI. All rights reserved.  [facility] [address], [place], [place] [address] Phone # [phone] Fax [phone]
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
## 2016-08-29 ENCOUNTER — Encounter: Payer: 59 | Admitting: Physical Therapy

## 2016-09-02 ENCOUNTER — Encounter: Payer: 59 | Admitting: Physical Therapy

## 2016-09-04 ENCOUNTER — Encounter: Payer: 59 | Admitting: Physical Therapy

## 2016-09-16 ENCOUNTER — Encounter: Payer: 59 | Admitting: Physical Therapy

## 2016-09-16 MED FILL — FLUTICASONE PROP 50 MCG SPR: 50 | 30 days supply | Qty: 16 | Fill #0

## 2016-09-18 ENCOUNTER — Ambulatory Visit: Payer: Self-pay | Admitting: Physical Therapy

## 2016-10-10 DIAGNOSIS — R5383 Other fatigue: Secondary | ICD-10-CM | POA: Diagnosis not present

## 2016-10-10 DIAGNOSIS — E782 Mixed hyperlipidemia: Secondary | ICD-10-CM | POA: Diagnosis not present

## 2016-10-10 DIAGNOSIS — Z0000 Encounter for general adult medical examination without abnormal findings: Secondary | ICD-10-CM | POA: Diagnosis not present

## 2016-10-10 DIAGNOSIS — E039 Hypothyroidism, unspecified: Secondary | ICD-10-CM | POA: Diagnosis not present

## 2016-10-21 MED FILL — ROSUVASTATIN CALCIUM 20 MG: 20 | 90 days supply | Qty: 90 | Fill #0

## 2016-10-24 MED FILL — LEVOTHYROXINE 50 MCG TABLET: 50 | 90 days supply | Qty: 140 | Fill #0

## 2016-10-25 MED FILL — FLUTICASONE PROP 50 MCG SPR: 50 | 90 days supply | Qty: 48 | Fill #0

## 2017-01-30 MED FILL — LEVOTHYROXINE 50 MCG TABLET: 50 | 90 days supply | Qty: 140 | Fill #0

## 2017-01-30 MED FILL — ROSUVASTATIN CALCIUM 20 MG: 20 | 90 days supply | Qty: 90 | Fill #0

## 2017-02-12 DIAGNOSIS — N6311 Unspecified lump in the right breast, upper outer quadrant: Secondary | ICD-10-CM | POA: Diagnosis not present

## 2017-02-17 ENCOUNTER — Other Ambulatory Visit: Payer: Self-pay | Admitting: Obstetrics and Gynecology

## 2017-02-18 ENCOUNTER — Other Ambulatory Visit: Payer: Self-pay | Admitting: Obstetrics and Gynecology

## 2017-02-18 DIAGNOSIS — N63 Unspecified lump in unspecified breast: Secondary | ICD-10-CM

## 2017-02-27 IMAGING — NM NM HEPATO W/GB/PHARM/[PERSON_NAME]
2 series · 12 of 12 positions shown · non-contrast
Comparison: 02/05/2016

CLINICAL DATA: Abdominal pain especially postprandial, epigastric
region.

EXAM:
NUCLEAR MEDICINE HEPATOBILIARY IMAGING WITH GALLBLADDER EF
TECHNIQUE: Sequential images of the abdomen were obtained [DATE] minutes
following intravenous administration of radiopharmaceutical. After
oral ingestion of Ensure, gallbladder ejection fraction was
determined. At 60 min, normal ejection fraction is greater than 33%.
RADIOPHARMACEUTICALS:  5.2 mCi Tc-PPm  Choletec IV

[he hepatobiliary · 3.43mm/px · 6 of 60 frames shown (1 of 2)]
[frame 6/60]
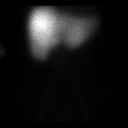
[frame 16/60]
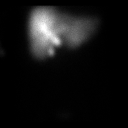
[frame 26/60]
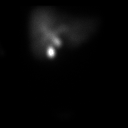
[frame 36/60]
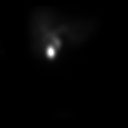
[frame 46/60]
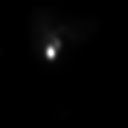
[frame 56/60]
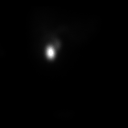

[he hepatobiliary · 3.43mm/px · 6 of 60 frames shown (2 of 2)]
[frame 6/60]
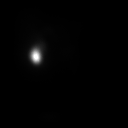
[frame 16/60]
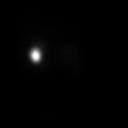
[frame 26/60]
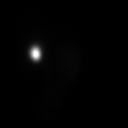
[frame 36/60]
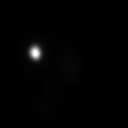
[frame 46/60]
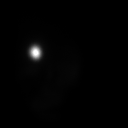
[frame 56/60]
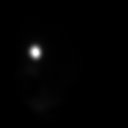

[12 of 12 positions shown; findings below may reference images not displayed]

FINDINGS: Satisfactory uptake of radiopharmaceutical from the blood pool.
Biliary activity by 10 minutes, gallbladder activity by 15 minutes,
bowel activity occurred after 60 minutes, following oral ingestion
of the Ensure meal.

Calculated gallbladder ejection fraction is 11%. (Normal gallbladder
ejection fraction with Ensure is greater than 33%.)
IMPRESSION: 1. Gallbladder dysfunction, with gallbladder of ejection fraction of
only 11% at 60 minutes (should be greater than 33%).

## 2017-03-11 ENCOUNTER — Ambulatory Visit
Admission: RE | Admit: 2017-03-11 | Discharge: 2017-03-11 | Disposition: A | Discharge status: Home / Self Care | Payer: 59 | Source: Ambulatory Visit | Attending: Obstetrics and Gynecology | Admitting: Obstetrics and Gynecology

## 2017-03-11 DIAGNOSIS — N63 Unspecified lump in unspecified breast: Secondary | ICD-10-CM

## 2017-03-11 DIAGNOSIS — H903 Sensorineural hearing loss, bilateral: Secondary | ICD-10-CM | POA: Diagnosis not present

## 2017-03-11 DIAGNOSIS — J342 Deviated nasal septum: Secondary | ICD-10-CM | POA: Diagnosis not present

## 2017-03-11 DIAGNOSIS — R922 Inconclusive mammogram: Secondary | ICD-10-CM | POA: Diagnosis not present

## 2017-03-11 DIAGNOSIS — N6312 Unspecified lump in the right breast, upper inner quadrant: Secondary | ICD-10-CM | POA: Diagnosis not present

## 2017-03-11 DIAGNOSIS — N6311 Unspecified lump in the right breast, upper outer quadrant: Secondary | ICD-10-CM | POA: Diagnosis not present

## 2017-04-15 DIAGNOSIS — L570 Actinic keratosis: Secondary | ICD-10-CM | POA: Diagnosis not present

## 2017-04-15 DIAGNOSIS — Z85828 Personal history of other malignant neoplasm of skin: Secondary | ICD-10-CM | POA: Diagnosis not present

## 2017-04-15 DIAGNOSIS — L821 Other seborrheic keratosis: Secondary | ICD-10-CM | POA: Diagnosis not present

## 2017-04-18 MED FILL — FLUOROURACIL 5% CREAM: 5 | 14 days supply | Qty: 40 | Fill #0

## 2017-04-21 MED FILL — FLUTICASONE PROP 50 MCG SPR: 50 | 90 days supply | Qty: 48 | Fill #1 | Status: TO

## 2017-04-21 MED FILL — ROSUVASTATIN CALCIUM 20 MG: 20 | 90 days supply | Qty: 90 | Fill #1

## 2017-04-21 MED FILL — LEVOTHYROXINE 50 MCG TABLET: 50 | 90 days supply | Qty: 140 | Fill #1

## 2017-05-09 DIAGNOSIS — H903 Sensorineural hearing loss, bilateral: Secondary | ICD-10-CM | POA: Diagnosis not present

## 2017-05-09 DIAGNOSIS — Z01419 Encounter for gynecological examination (general) (routine) without abnormal findings: Secondary | ICD-10-CM | POA: Diagnosis not present

## 2017-05-09 DIAGNOSIS — Z6820 Body mass index (BMI) 20.0-20.9, adult: Secondary | ICD-10-CM | POA: Diagnosis not present

## 2017-05-09 DIAGNOSIS — Z1231 Encounter for screening mammogram for malignant neoplasm of breast: Secondary | ICD-10-CM | POA: Diagnosis not present

## 2017-05-09 MED FILL — IMVEXXY MAINTENANCE PACK 10: 10 | 28 days supply | Qty: 8 | Fill #0

## 2017-05-12 DIAGNOSIS — Z86010 Personal history of colonic polyps: Secondary | ICD-10-CM | POA: Diagnosis not present

## 2017-05-12 DIAGNOSIS — K5901 Slow transit constipation: Secondary | ICD-10-CM | POA: Diagnosis not present

## 2017-05-12 DIAGNOSIS — K5730 Diverticulosis of large intestine without perforation or abscess without bleeding: Secondary | ICD-10-CM | POA: Diagnosis not present

## 2017-05-28 ENCOUNTER — Telehealth: Payer: 59 | Admitting: Nurse Practitioner

## 2017-05-28 DIAGNOSIS — R05 Cough: Secondary | ICD-10-CM

## 2017-05-28 DIAGNOSIS — R059 Cough, unspecified: Secondary | ICD-10-CM

## 2017-05-28 MED ORDER — BENZONATATE 100 MG PO CAPS: 100.0000 mg | ORAL_CAPSULE | Freq: Three times a day (TID) | ORAL | 0 refills | Status: AC | PRN

## 2017-05-28 MED ORDER — PREDNISONE 10 MG (21) PO TBPK: ORAL_TABLET | ORAL | 0 refills | Status: DC

## 2017-05-28 MED FILL — BENZONATATE 100 MG CAP: 100 | 7 days supply | Qty: 20 | Fill #0

## 2017-05-28 MED FILL — predniSONE 10 MG (21) TBPK: 10 | 6 days supply | Qty: 21 | Fill #0

## 2017-05-28 NOTE — Progress Notes (Signed)
We are sorry that you are not feeling well.  Here is how we plan to help!  Based on your presentation I believe you most likely have A cough due to a virus.  This is called viral bronchitis and is best treated by rest, plenty of fluids and control of the cough.  You may use Ibuprofen or Tylenol as directed to help your symptoms.     In addition you may use A prescription cough medication called Tessalon Perles 100mg . You may take 1-2 capsules every 8 hours as needed for your cough.  Sterapred 10 mg dosepak  From your responses in the eVisit questionnaire you describe inflammation in the upper respiratory tract which is causing a significant cough.  This is commonly called Bronchitis and has four common causes:    Allergies  Viral Infections  Acid Reflux  Bacterial Infection Allergies, viruses and acid reflux are treated by controlling symptoms or eliminating the cause. An example might be a cough caused by taking certain blood pressure medications. You stop the cough by changing the medication. Another example might be a cough caused by acid reflux. Controlling the reflux helps control the cough.  USE OF BRONCHODILATOR ("RESCUE") INHALERS: There is a risk from using your bronchodilator too frequently.  The risk is that over-reliance on a medication which only relaxes the muscles surrounding the breathing tubes can reduce the effectiveness of medications prescribed to reduce swelling and congestion of the tubes themselves.  Although you feel brief relief from the bronchodilator inhaler, your asthma may actually be worsening with the tubes becoming more swollen and filled with mucus.  This can delay other crucial treatments, such as oral steroid medications. If you need to use a bronchodilator inhaler daily, several times per day, you should discuss this with your provider.  There are probably better treatments that could be used to keep your asthma under control.     HOME CARE . Only take  medications as instructed by your medical team. . Complete the entire course of an antibiotic. . Drink plenty of fluids and get plenty of rest. . Avoid close contacts especially the very young and the elderly . Cover your mouth if you cough or cough into your sleeve. . Always remember to wash your hands . A steam or ultrasonic humidifier can help congestion.   GET HELP RIGHT AWAY IF: . You develop worsening fever. . You become short of breath . You cough up blood. . Your symptoms persist after you have completed your treatment plan MAKE SURE YOU   Understand these instructions.  Will watch your condition.  Will get help right away if you are not doing well or get worse.  Your e-visit answers were reviewed by a board certified advanced clinical practitioner to complete your personal care plan.  Depending on the condition, your plan could have included both over the counter or prescription medications. If there is a problem please reply  once you have received a response from your provider. Your safety is important to [place].  If you have drug allergies check your prescription carefully.    You can use MyChart to ask questions about today's visit, request a non-urgent call back, or ask for a work or school excuse for 24 hours related to this e-Visit. If it has been greater than 24 hours you will need to follow up with your provider, or enter a new e-Visit to address those concerns. You will get an e-mail in the next two days asking about  your experience.  I hope that your e-visit has been valuable and will speed your recovery. Thank you for using e-visits.

## 2017-06-02 DIAGNOSIS — J399 Disease of upper respiratory tract, unspecified: Secondary | ICD-10-CM | POA: Diagnosis not present

## 2017-06-02 DIAGNOSIS — R05 Cough: Secondary | ICD-10-CM | POA: Diagnosis not present

## 2017-06-02 MED FILL — HYDROCODONE-HOMATROPINE SOL: 5-1.5 | 10 days supply | Qty: 100 | Fill #0

## 2017-06-02 MED FILL — AZITHROMYCIN 250 MG TABLET: 250 | 5 days supply | Qty: 6 | Fill #0

## 2017-07-10 MED FILL — IMVEXXY MAINTENANCE PACK 10: 10 | 28 days supply | Qty: 8 | Fill #1

## 2017-08-05 MED FILL — IMVEXXY MAINTENANCE PACK 10: 10 | 28 days supply | Qty: 8 | Fill #2

## 2017-08-05 MED FILL — ROSUVASTATIN CALCIUM 20 MG: 20 | 90 days supply | Qty: 90 | Fill #2

## 2017-08-05 MED FILL — LEVOTHYROXINE 50 MCG TABLET: 50 | 90 days supply | Qty: 129 | Fill #0

## 2017-08-20 MED FILL — FLUTICASONE PROP 50 MCG SPR: 50 | 90 days supply | Qty: 48 | Fill #0

## 2017-08-29 MED FILL — IMVEXXY MAINTENANCE PACK 10: 10 | 28 days supply | Qty: 8 | Fill #3

## 2017-09-22 MED FILL — IMVEXXY MAINTENANCE PACK 10: 10 | 28 days supply | Qty: 8 | Fill #4

## 2017-10-08 MED FILL — FLUCONAZOLE 150 MG TABS: 150 | 4 days supply | Qty: 2 | Fill #0

## 2017-10-08 MED FILL — DOXYCYCLINE HYCLATE 100 MG: 100 | 10 days supply | Qty: 20 | Fill #0

## 2017-10-12 DIAGNOSIS — S80261A Insect bite (nonvenomous), right knee, initial encounter: Secondary | ICD-10-CM | POA: Diagnosis not present

## 2017-10-12 DIAGNOSIS — R21 Rash and other nonspecific skin eruption: Secondary | ICD-10-CM | POA: Diagnosis not present

## 2017-10-16 MED FILL — IMVEXXY MAINTENANCE PACK 10: 10 | 28 days supply | Qty: 8 | Fill #5

## 2017-10-17 DIAGNOSIS — W57XXXA Bitten or stung by nonvenomous insect and other nonvenomous arthropods, initial encounter: Secondary | ICD-10-CM | POA: Diagnosis not present

## 2017-10-17 DIAGNOSIS — S90861D Insect bite (nonvenomous), right foot, subsequent encounter: Secondary | ICD-10-CM | POA: Diagnosis not present

## 2017-10-17 DIAGNOSIS — R208 Other disturbances of skin sensation: Secondary | ICD-10-CM | POA: Diagnosis not present

## 2017-10-17 DIAGNOSIS — Z7689 Persons encountering health services in other specified circumstances: Secondary | ICD-10-CM | POA: Diagnosis not present

## 2017-10-17 DIAGNOSIS — W57XXXD Bitten or stung by nonvenomous insect and other nonvenomous arthropods, subsequent encounter: Secondary | ICD-10-CM | POA: Diagnosis not present

## 2017-11-10 MED FILL — IMVEXXY MAINTENANCE PACK 10: 10 | 28 days supply | Qty: 8 | Fill #6

## 2017-12-04 MED FILL — IMVEXXY MAINTENANCE PACK 10: 10 | 28 days supply | Qty: 8 | Fill #7

## 2017-12-29 DIAGNOSIS — E538 Deficiency of other specified B group vitamins: Secondary | ICD-10-CM | POA: Diagnosis not present

## 2017-12-29 DIAGNOSIS — E559 Vitamin D deficiency, unspecified: Secondary | ICD-10-CM | POA: Diagnosis not present

## 2017-12-29 DIAGNOSIS — E039 Hypothyroidism, unspecified: Secondary | ICD-10-CM | POA: Diagnosis not present

## 2017-12-29 DIAGNOSIS — E7800 Pure hypercholesterolemia, unspecified: Secondary | ICD-10-CM | POA: Diagnosis not present

## 2017-12-29 MED FILL — ROSUVASTATIN CALCIUM 20 MG: 20 | 90 days supply | Qty: 90 | Fill #0

## 2017-12-30 MED FILL — LEVOTHYROXINE 50 MCG TABLET: 50 | 90 days supply | Qty: 140 | Fill #0

## 2018-03-11 DIAGNOSIS — L237 Allergic contact dermatitis due to plants, except food: Secondary | ICD-10-CM | POA: Diagnosis not present

## 2018-03-11 DIAGNOSIS — R05 Cough: Secondary | ICD-10-CM | POA: Diagnosis not present

## 2018-03-11 DIAGNOSIS — J069 Acute upper respiratory infection, unspecified: Secondary | ICD-10-CM | POA: Diagnosis not present

## 2018-03-11 MED FILL — predniSONE 5 MG TABS: 5 | 6 days supply | Qty: 21 | Fill #0

## 2018-03-11 MED FILL — HYDROCODONE-HOMATROPINE SOL: 5-1.5 | 14 days supply | Qty: 140 | Fill #0

## 2018-03-17 MED FILL — IMVEXXY MAINTENANCE PACK 10: 10 | 28 days supply | Qty: 8 | Fill #8

## 2018-03-25 DIAGNOSIS — H903 Sensorineural hearing loss, bilateral: Secondary | ICD-10-CM | POA: Diagnosis not present

## 2018-03-25 DIAGNOSIS — J302 Other seasonal allergic rhinitis: Secondary | ICD-10-CM | POA: Diagnosis not present

## 2018-03-25 DIAGNOSIS — J342 Deviated nasal septum: Secondary | ICD-10-CM | POA: Diagnosis not present

## 2018-03-25 DIAGNOSIS — H6983 Other specified disorders of Eustachian tube, bilateral: Secondary | ICD-10-CM | POA: Diagnosis not present

## 2018-03-25 MED FILL — FLUTICASONE PROP 50 MCG SPR: 50 | 30 days supply | Qty: 16 | Fill #0

## 2018-03-31 MED FILL — ROSUVASTATIN CALCIUM 20 MG: 20 | 90 days supply | Qty: 90 | Fill #1

## 2018-03-31 MED FILL — LEVOTHYROXINE 50 MCG TABLET: 50 | 90 days supply | Qty: 140 | Fill #1

## 2018-04-10 MED FILL — IMVEXXY MAINTENANCE PACK 10: 10 | 28 days supply | Qty: 8 | Fill #9

## 2018-04-22 MED FILL — IMVEXXY MAINTENANCE PACK 10: 10 | 28 days supply | Qty: 8 | Fill #9

## 2018-04-24 MED FILL — FLUTICASONE PROP 50 MCG SPR: 50 | 30 days supply | Qty: 16 | Fill #1

## 2018-05-11 DIAGNOSIS — Z1231 Encounter for screening mammogram for malignant neoplasm of breast: Secondary | ICD-10-CM | POA: Diagnosis not present

## 2018-05-11 DIAGNOSIS — R87810 Cervical high risk human papillomavirus (HPV) DNA test positive: Secondary | ICD-10-CM | POA: Diagnosis not present

## 2018-05-11 DIAGNOSIS — Z01419 Encounter for gynecological examination (general) (routine) without abnormal findings: Secondary | ICD-10-CM | POA: Diagnosis not present

## 2018-05-11 DIAGNOSIS — Z1151 Encounter for screening for human papillomavirus (HPV): Secondary | ICD-10-CM | POA: Diagnosis not present

## 2018-05-11 DIAGNOSIS — Z6821 Body mass index (BMI) 21.0-21.9, adult: Secondary | ICD-10-CM | POA: Diagnosis not present

## 2018-05-25 MED FILL — FLUTICASONE PROP 50 MCG SPR: 50 | 30 days supply | Qty: 16 | Fill #2

## 2018-05-29 MED FILL — IMVEXXY MAINTENANCE PACK 10: 10 | 28 days supply | Qty: 8 | Fill #0

## 2018-06-22 MED FILL — LEVOTHYROXINE 50 MCG TABLET: 50 | 84 days supply | Qty: 120 | Fill #0

## 2018-06-22 MED FILL — ROSUVASTATIN CALCIUM 20 MG: 20 | 90 days supply | Qty: 90 | Fill #2

## 2018-06-22 MED FILL — FLUTICASONE PROP 50 MCG SPR: 50 | 30 days supply | Qty: 16 | Fill #3 | Status: TO

## 2018-06-23 MED FILL — IMVEXXY MAINTENANCE PACK 10: 10 | 28 days supply | Qty: 8 | Fill #1 | Status: TO

## 2018-07-23 MED FILL — FLUTICASONE PROP 50 MCG SPR: 50 | 30 days supply | Qty: 16 | Fill #0

## 2018-08-04 DIAGNOSIS — R87810 Cervical high risk human papillomavirus (HPV) DNA test positive: Secondary | ICD-10-CM | POA: Diagnosis not present

## 2018-08-04 DIAGNOSIS — R300 Dysuria: Secondary | ICD-10-CM | POA: Diagnosis not present

## 2018-08-04 DIAGNOSIS — R350 Frequency of micturition: Secondary | ICD-10-CM | POA: Diagnosis not present

## 2018-08-13 MED FILL — IMVEXXY MAINTENANCE PACK 10: 10 | 28 days supply | Qty: 8 | Fill #0

## 2018-08-22 MED FILL — FLUTICASONE PROP 50 MCG SPR: 50 | 30 days supply | Qty: 16 | Fill #1

## 2018-09-04 MED FILL — IMVEXXY MAINTENANCE PACK 10: 10 | 28 days supply | Qty: 8 | Fill #1

## 2018-09-21 MED FILL — FLUTICASONE PROP 50 MCG SPR: 50 | 30 days supply | Qty: 16 | Fill #2

## 2018-09-21 MED FILL — LEVOTHYROXINE 50 MCG TABLET: 50 | 84 days supply | Qty: 120 | Fill #0

## 2018-10-02 MED FILL — IMVEXXY MAINTENANCE PACK 10: 10 | 28 days supply | Qty: 8 | Fill #2

## 2018-10-20 MED FILL — FLUTICASONE PROP 50 MCG SPR: 50 | 30 days supply | Qty: 16 | Fill #3

## 2018-10-20 MED FILL — ROSUVASTATIN CALCIUM 20 MG: 20 | 90 days supply | Qty: 90 | Fill #0

## 2018-10-30 MED FILL — IMVEXXY MAINTENANCE PACK 10: 10 | 28 days supply | Qty: 8 | Fill #3

## 2018-11-27 MED FILL — IMVEXXY MAINTENANCE PACK 10: 10 | 28 days supply | Qty: 8 | Fill #4

## 2018-12-18 MED FILL — FLUTICASONE PROP 50 MCG SPR: 50 | 30 days supply | Qty: 16 | Fill #4

## 2018-12-25 MED FILL — IMVEXXY MAINTENANCE PACK 10: 10 | 28 days supply | Qty: 8 | Fill #5

## 2018-12-29 DIAGNOSIS — I10 Essential (primary) hypertension: Secondary | ICD-10-CM | POA: Diagnosis not present

## 2018-12-29 DIAGNOSIS — E538 Deficiency of other specified B group vitamins: Secondary | ICD-10-CM | POA: Diagnosis not present

## 2018-12-29 DIAGNOSIS — E559 Vitamin D deficiency, unspecified: Secondary | ICD-10-CM | POA: Diagnosis not present

## 2018-12-29 DIAGNOSIS — E039 Hypothyroidism, unspecified: Secondary | ICD-10-CM | POA: Diagnosis not present

## 2018-12-29 DIAGNOSIS — E782 Mixed hyperlipidemia: Secondary | ICD-10-CM | POA: Diagnosis not present

## 2018-12-31 MED FILL — LEVOTHYROXINE 50 MCG TABLET: 50 | 30 days supply | Qty: 48 | Fill #0

## 2019-01-11 DIAGNOSIS — S139XXA Sprain of joints and ligaments of unspecified parts of neck, initial encounter: Secondary | ICD-10-CM | POA: Diagnosis not present

## 2019-01-11 MED FILL — predniSONE 10 MG TABS: 10 | 12 days supply | Qty: 48 | Fill #0

## 2019-01-11 MED FILL — CYCLOBENZAPRINE HCL 10 MG T: 10 | 15 days supply | Qty: 15 | Fill #0

## 2019-01-22 MED FILL — FLUTICASONE PROP 50 MCG SPR: 50 | 30 days supply | Qty: 16 | Fill #5

## 2019-01-22 MED FILL — ROSUVASTATIN CALCIUM 20 MG: 20 | 90 days supply | Qty: 90 | Fill #0

## 2019-01-23 MED FILL — IMVEXXY MAINTENANCE PACK 10: 10 | 28 days supply | Qty: 8 | Fill #6

## 2019-02-08 DIAGNOSIS — M542 Cervicalgia: Secondary | ICD-10-CM | POA: Diagnosis not present

## 2019-02-09 MED FILL — LEVOTHYROXINE 50 MCG TABLET: 50 | 90 days supply | Qty: 144 | Fill #1

## 2019-02-15 DIAGNOSIS — M542 Cervicalgia: Secondary | ICD-10-CM | POA: Diagnosis not present

## 2019-02-20 MED FILL — IMVEXXY MAINTENANCE PACK 10: 10 | 28 days supply | Qty: 8 | Fill #7

## 2019-02-22 DIAGNOSIS — M542 Cervicalgia: Secondary | ICD-10-CM | POA: Diagnosis not present

## 2019-02-24 DIAGNOSIS — M542 Cervicalgia: Secondary | ICD-10-CM | POA: Diagnosis not present

## 2019-02-24 MED FILL — traMADol HCL 50 MG TABS: 50 | 5 days supply | Qty: 20 | Fill #0

## 2019-03-01 DIAGNOSIS — H903 Sensorineural hearing loss, bilateral: Secondary | ICD-10-CM | POA: Diagnosis not present

## 2019-03-02 DIAGNOSIS — M542 Cervicalgia: Secondary | ICD-10-CM | POA: Diagnosis not present

## 2019-03-09 DIAGNOSIS — Z85828 Personal history of other malignant neoplasm of skin: Secondary | ICD-10-CM | POA: Diagnosis not present

## 2019-03-09 DIAGNOSIS — L570 Actinic keratosis: Secondary | ICD-10-CM | POA: Diagnosis not present

## 2019-03-09 DIAGNOSIS — L821 Other seborrheic keratosis: Secondary | ICD-10-CM | POA: Diagnosis not present

## 2019-03-19 MED FILL — IMVEXXY MAINTENANCE PACK 10: 10 | 28 days supply | Qty: 8 | Fill #8

## 2019-03-19 MED FILL — FLUTICASONE PROP 50 MCG SPR: 50 | 30 days supply | Qty: 16 | Fill #6

## 2019-04-12 DIAGNOSIS — M542 Cervicalgia: Secondary | ICD-10-CM | POA: Diagnosis not present

## 2019-04-12 MED FILL — traMADol HCL 50 MG TABS: 50 | 5 days supply | Qty: 20 | Fill #0

## 2019-04-13 DIAGNOSIS — M542 Cervicalgia: Secondary | ICD-10-CM | POA: Diagnosis not present

## 2019-04-13 MED FILL — predniSONE 10 MG TABS: 10 | 12 days supply | Qty: 48 | Fill #0

## 2019-04-13 MED FILL — CYCLOBENZAPRINE HCL 10 MG T: 10 | 20 days supply | Qty: 60 | Fill #0

## 2019-04-16 MED FILL — IMVEXXY MAINTENANCE PACK 10: 10 | 28 days supply | Qty: 8 | Fill #9

## 2019-04-21 DIAGNOSIS — Z01419 Encounter for gynecological examination (general) (routine) without abnormal findings: Secondary | ICD-10-CM | POA: Diagnosis not present

## 2019-04-21 DIAGNOSIS — Z6821 Body mass index (BMI) 21.0-21.9, adult: Secondary | ICD-10-CM | POA: Diagnosis not present

## 2019-04-21 DIAGNOSIS — Z1231 Encounter for screening mammogram for malignant neoplasm of breast: Secondary | ICD-10-CM | POA: Diagnosis not present

## 2019-04-21 DIAGNOSIS — Z1151 Encounter for screening for human papillomavirus (HPV): Secondary | ICD-10-CM | POA: Diagnosis not present

## 2019-04-22 MED FILL — ROSUVASTATIN CALCIUM 20 MG: 20 | 90 days supply | Qty: 90 | Fill #0

## 2019-04-22 MED FILL — LEVOTHYROXINE 50 MCG TABLET: 50 | 30 days supply | Qty: 48 | Fill #0

## 2019-04-27 MED FILL — FLUTICASONE PROP 50 MCG SPR: 50 | 30 days supply | Qty: 16 | Fill #0

## 2019-05-12 DIAGNOSIS — R300 Dysuria: Secondary | ICD-10-CM | POA: Diagnosis not present

## 2019-05-12 MED FILL — CIPROFLOXACIN HCL 500 MG TA: 500 | 3 days supply | Qty: 6 | Fill #0

## 2019-07-29 MED FILL — CYCLOBENZAPRINE HCL 10 MG T: 10 | 20 days supply | Qty: 60 | Fill #1

## 2019-09-14 MED FILL — DOXYCYCLINE HYCLATE 100 MG: 100 | 14 days supply | Qty: 28 | Fill #0

## 2019-09-15 DIAGNOSIS — M25512 Pain in left shoulder: Secondary | ICD-10-CM | POA: Diagnosis not present

## 2019-09-15 DIAGNOSIS — S139XXA Sprain of joints and ligaments of unspecified parts of neck, initial encounter: Secondary | ICD-10-CM | POA: Diagnosis not present

## 2019-09-15 MED FILL — traMADol HCL 50 MG TABS: 50 | 5 days supply | Qty: 20 | Fill #0

## 2019-09-22 ENCOUNTER — Telehealth: Payer: 59 | Admitting: Nurse Practitioner

## 2019-09-22 DIAGNOSIS — R110 Nausea: Secondary | ICD-10-CM

## 2019-09-22 DIAGNOSIS — W57XXXD Bitten or stung by nonvenomous insect and other nonvenomous arthropods, subsequent encounter: Secondary | ICD-10-CM | POA: Diagnosis not present

## 2019-09-22 DIAGNOSIS — B3731 Acute candidiasis of vulva and vagina: Secondary | ICD-10-CM

## 2019-09-22 DIAGNOSIS — B373 Candidiasis of vulva and vagina: Secondary | ICD-10-CM

## 2019-09-22 MED ORDER — FLUCONAZOLE 150 MG PO TABS: 150.0000 mg | ORAL_TABLET | Freq: Once | ORAL | 0 refills | Status: AC

## 2019-09-22 MED ORDER — ONDANSETRON HCL 4 MG PO TABS: 4.0000 mg | ORAL_TABLET | Freq: Three times a day (TID) | ORAL | 0 refills | Status: AC | PRN

## 2019-09-22 NOTE — Progress Notes (Signed)
Thank you for describing your tick bite, Here is how we plan to help! Based on the information that you shared with me it looks like you have An infected or complicated tick bite that requires a longer course of antibiotics and will need for you to schedule a follow-up visit with a provider.  In most cases a tick bite is painless and does not itch.  Most tick bites in which the tick is quickly removed do not require prescriptions. Ticks can transmit several diseases if they are infected and remain attacked to your skin. Therefore the length that the tick was attached and any symptoms you have experienced after the bite are import to accurately develop your custom treatment plan. In most cases a single dose of doxycycline may prevent the development of a more serious condition.  Based on your information I recommend that you continue the doxycycline, as thi I the bet treatment for a tick bite. I will send in a prescription for nausea for you that may help. I will also send in a diflucan for your yeast infection.  Which ticks  are associated with illness?  The Wood Tick (dog tick) is the size of a watermelon seed and can sometimes transmit [facility] spotted fever and [place] tick fever.   The Deer Tick (black-legged tick) is between the size of a poppy seed (pin head) and an apple seed, and can sometimes transmit Lyme disease.  A brown to black tick with a white splotch on its back is likely a female Amblyomma americanum ([place] tick). This tick has been associated with Southern Tick Associated illness ( STARI)  Lyme disease has become the most common tick-borne illness in the [place]. The risk of Lyme disease following a recognized deer tick bite is estimated to be 1%.  The majority of cases of Lyme disease start with a bull's eye rash at the site of the tick bite. The rash can occur days to weeks (typically 7-10 days) after a tick bite. Treatment with antibiotics is indicated if  this rash appears. Flu-like symptoms may accompany the rash, including: fever, chills, headaches, muscle aches, and fatigue. Removing ticks promptly may prevent tick borne disease.  What can be used to prevent Tick Bites?   Insect repellant with at leas 20% DEET.  Wearing long pants with sock and shoes.  Avoiding tall grass and heavily wooded areas.  Checking your skin after being outdoors.  Shower with a washcloth after outdoor exposures.  HOME CARE ADVICE FOR TICK BITE  1. Wood Tick Removal:  o Use a pair of tweezers and grasp the wood tick close to the skin (on its head). Pull the wood tick straight upward without twisting or crushing it. Maintain a steady pressure until it releases its grip.   o If tweezers aren't available, use fingers, a loop of thread around the jaws, or a needle between the jaws for traction.  o Note: covering the tick with petroleum jelly, nail polish or rubbing alcohol doesn't work. Neither does touching the tick with a hot or cold object. 2. Tiny Deer Tick Removal:   o Needs to be scraped off with a knife blade or credit card edge. o Place tick in a sealed container (e.g. glass jar, zip lock plastic bag), in case your doctor wants to see it. 3. Tick's Head Removal:  o If the wood tick's head breaks off in the skin, it must be removed. Clean the skin. Then use a sterile needle to  uncover the head and lift it out or scrape it off.  o If a very small piece of the head remains, the skin will eventually slough it off. 4. Antibiotic Ointment:  o Wash the wound and your hands with soap and water after removal to prevent catching any tick disease.  Apply an over the counter antibiotic ointment (e.g. bacitracin) to the bite once. 5. Expected Course: Tick bites normally don't itch or hurt. That's why they often go unnoticed. 6. Call Your Doctor If:  o You can't remove the tick or the tick's head o Fever, a severe head ache, or rash occur in the next 2 weeks o Bite  begins to look infected o Lyme's disease is common in your area o You have not had a tetanus in the last 10 years o Your current symptoms become worse    MAKE SURE YOU   Understand these instructions.  Will watch your condition.  Will get help right away if you are not doing well or get worse.   Thank you for choosing an e-visit.  Your e-visit answers were reviewed by a board certified advanced clinical practitioner to complete your personal care plan. Depending upon the condition, your plan could have included both over the counter or prescription medications. Please review your pharmacy choice. If there is a problem you may use MyChart messaging to have the prescription routed to another pharmacy. Your safety is important to [place]. If you have drug allergies check your prescription carefully.   You can use MyChart to ask questions about today's visit, request a non-urgent call back, or ask for a work or school excuse for 24 hours related to this e-Visit. If it has been greater than 24 hours you will need to follow up with your provider, or enter a new e-Visit to address those concerns.  You will get an email in the next two days asking about your experience. I hope  that your e-visit has been valuable and will speed your recovery  5-10 minutes spent reviewing and documenting in chart.

## 2019-11-04 MED FILL — CYCLOBENZAPRINE HCL 10 MG T: 10 | 20 days supply | Qty: 60 | Fill #2

## 2020-01-13 ENCOUNTER — Other Ambulatory Visit (HOSPITAL_BASED_OUTPATIENT_CLINIC_OR_DEPARTMENT_OTHER): Payer: Self-pay | Admitting: Physician Assistant

## 2020-01-13 DIAGNOSIS — E782 Mixed hyperlipidemia: Secondary | ICD-10-CM | POA: Diagnosis not present

## 2020-01-13 DIAGNOSIS — Z0000 Encounter for general adult medical examination without abnormal findings: Secondary | ICD-10-CM | POA: Diagnosis not present

## 2020-01-13 DIAGNOSIS — E039 Hypothyroidism, unspecified: Secondary | ICD-10-CM | POA: Diagnosis not present

## 2020-01-13 DIAGNOSIS — E559 Vitamin D deficiency, unspecified: Secondary | ICD-10-CM | POA: Diagnosis not present

## 2020-01-13 DIAGNOSIS — Z23 Encounter for immunization: Secondary | ICD-10-CM | POA: Diagnosis not present

## 2020-01-13 MED FILL — ROSUVASTATIN CALCIUM 20 MG: 20 | 90 days supply | Qty: 90 | Fill #0

## 2020-01-13 MED FILL — LEVOTHYROXINE SODIUM 50 MCG: 50 | 90 days supply | Qty: 144 | Fill #0

## 2020-01-14 MED FILL — FLUTICASONE PROP 50 MCG SPR: 50 | 30 days supply | Qty: 16 | Fill #1

## 2020-02-15 ENCOUNTER — Other Ambulatory Visit (HOSPITAL_BASED_OUTPATIENT_CLINIC_OR_DEPARTMENT_OTHER): Payer: Self-pay | Admitting: Internal Medicine

## 2020-02-15 MED FILL — FLUARIX QUADRIVALENT 0.5 ML: 0.5 | 1 days supply | Qty: 1 | Fill #0

## 2020-02-17 DIAGNOSIS — E559 Vitamin D deficiency, unspecified: Secondary | ICD-10-CM | POA: Diagnosis not present

## 2020-02-17 DIAGNOSIS — E782 Mixed hyperlipidemia: Secondary | ICD-10-CM | POA: Diagnosis not present

## 2020-02-17 DIAGNOSIS — E039 Hypothyroidism, unspecified: Secondary | ICD-10-CM | POA: Diagnosis not present

## 2020-02-28 ENCOUNTER — Other Ambulatory Visit (HOSPITAL_BASED_OUTPATIENT_CLINIC_OR_DEPARTMENT_OTHER): Payer: Self-pay | Admitting: Internal Medicine

## 2020-02-28 ENCOUNTER — Ambulatory Visit: Payer: 59 | Attending: Internal Medicine

## 2020-02-28 DIAGNOSIS — Z23 Encounter for immunization: Secondary | ICD-10-CM

## 2020-02-28 NOTE — Progress Notes (Signed)
° °  Covid-19 Vaccination Clinic  Name:  Ashley Burke    MRN: [id number] DOB: 1960/10/06  02/28/2020  Ashley Burke was observed post Covid-19 immunization for 15 minutes without incident. She was provided with Vaccine Information Sheet and instruction to access the V-Safe system. Vaccinated by Harriet Pho.  Ashley Burke was instructed to call 911 with any severe reactions post vaccine:  Difficulty breathing   Swelling of face and throat   A fast heartbeat   A bad rash all over body   Dizziness and weakness

## 2020-03-07 MED FILL — PFIZER-BIONTECH COVID-19 VA: 30 | 1 days supply | Qty: 0 | Fill #0

## 2020-03-10 DIAGNOSIS — H1131 Conjunctival hemorrhage, right eye: Secondary | ICD-10-CM | POA: Diagnosis not present

## 2020-04-10 MED FILL — LEVOTHYROXINE SODIUM 50 MCG: 50 | 90 days supply | Qty: 144 | Fill #1

## 2020-04-10 MED FILL — ROSUVASTATIN CALCIUM 20 MG: 20 | 90 days supply | Qty: 90 | Fill #1

## 2020-04-12 DIAGNOSIS — H524 Presbyopia: Secondary | ICD-10-CM | POA: Diagnosis not present

## 2020-04-12 DIAGNOSIS — H5203 Hypermetropia, bilateral: Secondary | ICD-10-CM | POA: Diagnosis not present

## 2020-04-12 DIAGNOSIS — H52223 Regular astigmatism, bilateral: Secondary | ICD-10-CM | POA: Diagnosis not present

## 2020-04-17 DIAGNOSIS — H903 Sensorineural hearing loss, bilateral: Secondary | ICD-10-CM | POA: Diagnosis not present

## 2020-04-19 DIAGNOSIS — E673 Hypervitaminosis D: Secondary | ICD-10-CM | POA: Diagnosis not present

## 2020-04-28 ENCOUNTER — Other Ambulatory Visit (HOSPITAL_BASED_OUTPATIENT_CLINIC_OR_DEPARTMENT_OTHER): Payer: Self-pay | Admitting: Physician Assistant

## 2020-04-28 MED FILL — FLUTICASONE PROP 50 MCG SPR: 50 | 60 days supply | Qty: 16 | Fill #0

## 2020-05-10 ENCOUNTER — Other Ambulatory Visit (HOSPITAL_BASED_OUTPATIENT_CLINIC_OR_DEPARTMENT_OTHER): Payer: Self-pay | Admitting: Obstetrics and Gynecology

## 2020-05-10 DIAGNOSIS — Z124 Encounter for screening for malignant neoplasm of cervix: Secondary | ICD-10-CM | POA: Diagnosis not present

## 2020-05-10 DIAGNOSIS — N952 Postmenopausal atrophic vaginitis: Secondary | ICD-10-CM | POA: Diagnosis not present

## 2020-05-10 DIAGNOSIS — Z6821 Body mass index (BMI) 21.0-21.9, adult: Secondary | ICD-10-CM | POA: Diagnosis not present

## 2020-05-10 DIAGNOSIS — Z1231 Encounter for screening mammogram for malignant neoplasm of breast: Secondary | ICD-10-CM | POA: Diagnosis not present

## 2020-05-10 DIAGNOSIS — Z01419 Encounter for gynecological examination (general) (routine) without abnormal findings: Secondary | ICD-10-CM | POA: Diagnosis not present

## 2020-05-10 DIAGNOSIS — Z01411 Encounter for gynecological examination (general) (routine) with abnormal findings: Secondary | ICD-10-CM | POA: Diagnosis not present

## 2020-05-10 DIAGNOSIS — Z113 Encounter for screening for infections with a predominantly sexual mode of transmission: Secondary | ICD-10-CM | POA: Diagnosis not present

## 2020-05-10 DIAGNOSIS — R32 Unspecified urinary incontinence: Secondary | ICD-10-CM | POA: Diagnosis not present

## 2020-05-10 MED FILL — PREMARIN VAGINAL CREAM-APPL: 0.625 | 30 days supply | Qty: 30 | Fill #0

## 2020-07-26 ENCOUNTER — Other Ambulatory Visit (HOSPITAL_BASED_OUTPATIENT_CLINIC_OR_DEPARTMENT_OTHER): Payer: Self-pay | Admitting: Physician Assistant

## 2020-07-26 DIAGNOSIS — I10 Essential (primary) hypertension: Secondary | ICD-10-CM | POA: Diagnosis not present

## 2020-07-26 DIAGNOSIS — E039 Hypothyroidism, unspecified: Secondary | ICD-10-CM | POA: Diagnosis not present

## 2020-07-26 DIAGNOSIS — E782 Mixed hyperlipidemia: Secondary | ICD-10-CM | POA: Diagnosis not present

## 2020-09-04 ENCOUNTER — Other Ambulatory Visit (HOSPITAL_BASED_OUTPATIENT_CLINIC_OR_DEPARTMENT_OTHER): Payer: Self-pay

## 2020-09-04 MED ORDER — PREMARIN 0.625 MG/GM VA CREA
TOPICAL_CREAM | VAGINAL | 7 refills | Status: AC
  Filled 2020-09-04: qty 30, 30d supply, fill #0
  Filled 2020-11-07: qty 30, 30d supply, fill #1
  Filled 2020-12-13: qty 30, 30d supply, fill #2
  Filled 2021-01-11: qty 30, 30d supply, fill #3
  Filled 2021-02-12: qty 30, 30d supply, fill #4
  Filled 2021-03-27: qty 30, 30d supply, fill #5
  Filled 2021-04-23: qty 30, 30d supply, fill #6

## 2020-09-07 ENCOUNTER — Other Ambulatory Visit (HOSPITAL_BASED_OUTPATIENT_CLINIC_OR_DEPARTMENT_OTHER): Payer: Self-pay

## 2020-09-07 MED FILL — Fluticasone Propionate Nasal Susp 50 MCG/ACT: NASAL | 60 days supply | Qty: 16 | Fill #0 | Status: CN

## 2020-11-03 ENCOUNTER — Other Ambulatory Visit (HOSPITAL_BASED_OUTPATIENT_CLINIC_OR_DEPARTMENT_OTHER): Payer: Self-pay

## 2020-11-03 MED ORDER — FLUTICASONE PROPIONATE 50 MCG/ACT NA SUSP
NASAL | 11 refills | Status: AC
  Filled 2020-11-03: qty 16, 30d supply, fill #0
  Filled 2020-12-13: qty 16, 30d supply, fill #1
  Filled 2021-01-11: qty 16, 30d supply, fill #2
  Filled 2021-02-12: qty 16, 30d supply, fill #3
  Filled 2021-03-26: qty 16, 30d supply, fill #4
  Filled 2021-04-23: qty 16, 30d supply, fill #5
  Filled 2021-10-01: qty 16, 30d supply, fill #6

## 2020-11-07 ENCOUNTER — Other Ambulatory Visit (HOSPITAL_BASED_OUTPATIENT_CLINIC_OR_DEPARTMENT_OTHER): Payer: Self-pay

## 2020-11-07 MED FILL — Levothyroxine Sodium Tab 50 MCG: ORAL | 90 days supply | Qty: 144 | Fill #0 | Status: AC

## 2020-11-07 MED FILL — Rosuvastatin Calcium Tab 20 MG: ORAL | 90 days supply | Qty: 90 | Fill #0 | Status: AC

## 2020-12-13 ENCOUNTER — Other Ambulatory Visit (HOSPITAL_BASED_OUTPATIENT_CLINIC_OR_DEPARTMENT_OTHER): Payer: Self-pay

## 2021-01-08 ENCOUNTER — Other Ambulatory Visit (HOSPITAL_COMMUNITY): Payer: Self-pay

## 2021-01-08 MED ORDER — CARESTART COVID-19 HOME TEST VI KIT
PACK | 0 refills | Status: AC
  Filled 2021-01-08: qty 4, 4d supply, fill #0

## 2021-01-11 ENCOUNTER — Other Ambulatory Visit (HOSPITAL_BASED_OUTPATIENT_CLINIC_OR_DEPARTMENT_OTHER): Payer: Self-pay

## 2021-02-08 ENCOUNTER — Other Ambulatory Visit (HOSPITAL_BASED_OUTPATIENT_CLINIC_OR_DEPARTMENT_OTHER): Payer: Self-pay

## 2021-02-08 DIAGNOSIS — E039 Hypothyroidism, unspecified: Secondary | ICD-10-CM | POA: Diagnosis not present

## 2021-02-08 DIAGNOSIS — E782 Mixed hyperlipidemia: Secondary | ICD-10-CM | POA: Diagnosis not present

## 2021-02-08 DIAGNOSIS — E559 Vitamin D deficiency, unspecified: Secondary | ICD-10-CM | POA: Diagnosis not present

## 2021-02-08 DIAGNOSIS — J309 Allergic rhinitis, unspecified: Secondary | ICD-10-CM | POA: Diagnosis not present

## 2021-02-08 MED ORDER — LEVOTHYROXINE SODIUM 50 MCG PO TABS
ORAL_TABLET | ORAL | 1 refills | Status: AC
  Filled 2021-02-08: qty 144, 90d supply, fill #0
  Filled 2021-04-23: qty 144, 90d supply, fill #1

## 2021-02-08 MED ORDER — ROSUVASTATIN CALCIUM 20 MG PO TABS
ORAL_TABLET | ORAL | 1 refills | Status: AC
  Filled 2021-02-08: qty 90, 90d supply, fill #0
  Filled 2021-04-23: qty 90, 90d supply, fill #1

## 2021-02-12 ENCOUNTER — Other Ambulatory Visit (HOSPITAL_BASED_OUTPATIENT_CLINIC_OR_DEPARTMENT_OTHER): Payer: Self-pay

## 2021-02-13 ENCOUNTER — Other Ambulatory Visit (HOSPITAL_BASED_OUTPATIENT_CLINIC_OR_DEPARTMENT_OTHER): Payer: Self-pay

## 2021-02-13 MED ORDER — INFLUENZA VAC SPLIT QUAD 0.5 ML IM SUSY
PREFILLED_SYRINGE | INTRAMUSCULAR | 0 refills | Status: AC
  Filled 2021-02-13: qty 0.5, 1d supply, fill #0

## 2021-03-26 ENCOUNTER — Other Ambulatory Visit (HOSPITAL_BASED_OUTPATIENT_CLINIC_OR_DEPARTMENT_OTHER): Payer: Self-pay

## 2021-03-26 DIAGNOSIS — D045 Carcinoma in situ of skin of trunk: Secondary | ICD-10-CM | POA: Diagnosis not present

## 2021-03-26 DIAGNOSIS — L570 Actinic keratosis: Secondary | ICD-10-CM | POA: Diagnosis not present

## 2021-03-26 DIAGNOSIS — D485 Neoplasm of uncertain behavior of skin: Secondary | ICD-10-CM | POA: Diagnosis not present

## 2021-03-26 DIAGNOSIS — Z85828 Personal history of other malignant neoplasm of skin: Secondary | ICD-10-CM | POA: Diagnosis not present

## 2021-03-26 MED ORDER — FLUOROURACIL 5 % EX CREA
TOPICAL_CREAM | CUTANEOUS | 0 refills | Status: AC
  Filled 2021-03-26: qty 40, 14d supply, fill #0

## 2021-03-27 ENCOUNTER — Other Ambulatory Visit (HOSPITAL_BASED_OUTPATIENT_CLINIC_OR_DEPARTMENT_OTHER): Payer: Self-pay

## 2021-04-10 DIAGNOSIS — D045 Carcinoma in situ of skin of trunk: Secondary | ICD-10-CM | POA: Diagnosis not present

## 2021-04-10 DIAGNOSIS — C44529 Squamous cell carcinoma of skin of other part of trunk: Secondary | ICD-10-CM | POA: Diagnosis not present

## 2021-04-10 DIAGNOSIS — Z85828 Personal history of other malignant neoplasm of skin: Secondary | ICD-10-CM | POA: Diagnosis not present

## 2021-04-19 DIAGNOSIS — H903 Sensorineural hearing loss, bilateral: Secondary | ICD-10-CM | POA: Diagnosis not present

## 2021-04-23 ENCOUNTER — Other Ambulatory Visit (HOSPITAL_BASED_OUTPATIENT_CLINIC_OR_DEPARTMENT_OTHER): Payer: Self-pay

## 2021-04-25 DIAGNOSIS — D3132 Benign neoplasm of left choroid: Secondary | ICD-10-CM | POA: Diagnosis not present

## 2021-04-25 DIAGNOSIS — H35373 Puckering of macula, bilateral: Secondary | ICD-10-CM | POA: Diagnosis not present

## 2021-04-25 DIAGNOSIS — H35342 Macular cyst, hole, or pseudohole, left eye: Secondary | ICD-10-CM | POA: Diagnosis not present

## 2021-05-02 DIAGNOSIS — Z1231 Encounter for screening mammogram for malignant neoplasm of breast: Secondary | ICD-10-CM | POA: Diagnosis not present

## 2021-05-10 ENCOUNTER — Other Ambulatory Visit (HOSPITAL_COMMUNITY): Payer: Self-pay

## 2021-05-10 DIAGNOSIS — R350 Frequency of micturition: Secondary | ICD-10-CM | POA: Diagnosis not present

## 2021-05-10 MED ORDER — SULFAMETHOXAZOLE-TRIMETHOPRIM 800-160 MG PO TABS
ORAL_TABLET | ORAL | 0 refills | Status: DC
  Filled 2021-05-10: qty 6, 3d supply, fill #0

## 2021-08-16 ENCOUNTER — Other Ambulatory Visit (HOSPITAL_BASED_OUTPATIENT_CLINIC_OR_DEPARTMENT_OTHER): Payer: Self-pay

## 2021-08-16 DIAGNOSIS — E782 Mixed hyperlipidemia: Secondary | ICD-10-CM | POA: Diagnosis not present

## 2021-08-16 DIAGNOSIS — Z0000 Encounter for general adult medical examination without abnormal findings: Secondary | ICD-10-CM | POA: Diagnosis not present

## 2021-08-16 DIAGNOSIS — I10 Essential (primary) hypertension: Secondary | ICD-10-CM | POA: Diagnosis not present

## 2021-08-16 DIAGNOSIS — E039 Hypothyroidism, unspecified: Secondary | ICD-10-CM | POA: Diagnosis not present

## 2021-08-16 DIAGNOSIS — E559 Vitamin D deficiency, unspecified: Secondary | ICD-10-CM | POA: Diagnosis not present

## 2021-08-16 MED ORDER — LEVOTHYROXINE SODIUM 50 MCG PO TABS
ORAL_TABLET | ORAL | 1 refills | Status: DC
  Filled 2021-08-16: qty 144, 90d supply, fill #0
  Filled 2021-11-15: qty 144, 90d supply, fill #1

## 2021-08-16 MED ORDER — ROSUVASTATIN CALCIUM 20 MG PO TABS
ORAL_TABLET | ORAL | 1 refills | Status: DC
  Filled 2021-08-16: qty 90, 90d supply, fill #0
  Filled 2021-10-24 – 2021-11-15 (×2): qty 90, 90d supply, fill #1

## 2021-08-21 ENCOUNTER — Other Ambulatory Visit (HOSPITAL_COMMUNITY): Payer: Self-pay

## 2021-08-21 ENCOUNTER — Other Ambulatory Visit (HOSPITAL_BASED_OUTPATIENT_CLINIC_OR_DEPARTMENT_OTHER): Payer: Self-pay

## 2021-08-22 DIAGNOSIS — Z6821 Body mass index (BMI) 21.0-21.9, adult: Secondary | ICD-10-CM | POA: Diagnosis not present

## 2021-08-22 DIAGNOSIS — Z0142 Encounter for cervical smear to confirm findings of recent normal smear following initial abnormal smear: Secondary | ICD-10-CM | POA: Diagnosis not present

## 2021-08-22 DIAGNOSIS — Z01419 Encounter for gynecological examination (general) (routine) without abnormal findings: Secondary | ICD-10-CM | POA: Diagnosis not present

## 2021-08-22 DIAGNOSIS — Z124 Encounter for screening for malignant neoplasm of cervix: Secondary | ICD-10-CM | POA: Diagnosis not present

## 2021-08-22 DIAGNOSIS — Z01411 Encounter for gynecological examination (general) (routine) with abnormal findings: Secondary | ICD-10-CM | POA: Diagnosis not present

## 2021-08-22 DIAGNOSIS — Z113 Encounter for screening for infections with a predominantly sexual mode of transmission: Secondary | ICD-10-CM | POA: Diagnosis not present

## 2021-10-01 ENCOUNTER — Other Ambulatory Visit (HOSPITAL_BASED_OUTPATIENT_CLINIC_OR_DEPARTMENT_OTHER): Payer: Self-pay

## 2021-10-10 ENCOUNTER — Other Ambulatory Visit (HOSPITAL_BASED_OUTPATIENT_CLINIC_OR_DEPARTMENT_OTHER): Payer: Self-pay

## 2021-10-24 ENCOUNTER — Other Ambulatory Visit (HOSPITAL_BASED_OUTPATIENT_CLINIC_OR_DEPARTMENT_OTHER): Payer: Self-pay

## 2021-11-02 ENCOUNTER — Other Ambulatory Visit (HOSPITAL_BASED_OUTPATIENT_CLINIC_OR_DEPARTMENT_OTHER): Payer: Self-pay

## 2021-11-14 DIAGNOSIS — H00025 Hordeolum internum left lower eyelid: Secondary | ICD-10-CM | POA: Diagnosis not present

## 2021-11-14 DIAGNOSIS — H35373 Puckering of macula, bilateral: Secondary | ICD-10-CM | POA: Diagnosis not present

## 2021-11-14 DIAGNOSIS — H35342 Macular cyst, hole, or pseudohole, left eye: Secondary | ICD-10-CM | POA: Diagnosis not present

## 2021-11-15 ENCOUNTER — Other Ambulatory Visit (HOSPITAL_BASED_OUTPATIENT_CLINIC_OR_DEPARTMENT_OTHER): Payer: Self-pay

## 2021-11-15 DIAGNOSIS — Z86010 Personal history of colonic polyps: Secondary | ICD-10-CM | POA: Diagnosis not present

## 2021-11-15 DIAGNOSIS — K5730 Diverticulosis of large intestine without perforation or abscess without bleeding: Secondary | ICD-10-CM | POA: Diagnosis not present

## 2021-11-15 DIAGNOSIS — E782 Mixed hyperlipidemia: Secondary | ICD-10-CM | POA: Diagnosis not present

## 2021-11-15 DIAGNOSIS — Z1211 Encounter for screening for malignant neoplasm of colon: Secondary | ICD-10-CM | POA: Diagnosis not present

## 2021-11-15 DIAGNOSIS — E039 Hypothyroidism, unspecified: Secondary | ICD-10-CM | POA: Diagnosis not present

## 2021-11-15 DIAGNOSIS — R634 Abnormal weight loss: Secondary | ICD-10-CM | POA: Diagnosis not present

## 2021-11-19 ENCOUNTER — Other Ambulatory Visit (HOSPITAL_BASED_OUTPATIENT_CLINIC_OR_DEPARTMENT_OTHER): Payer: Self-pay

## 2021-11-19 MED ORDER — FLUTICASONE PROPIONATE 50 MCG/ACT NA SUSP
NASAL | 5 refills | Status: AC
  Filled 2021-11-19: qty 16, 30d supply, fill #0
  Filled 2022-03-06: qty 16, 30d supply, fill #1
  Filled 2022-06-06: qty 16, 30d supply, fill #2

## 2021-11-20 ENCOUNTER — Other Ambulatory Visit (HOSPITAL_BASED_OUTPATIENT_CLINIC_OR_DEPARTMENT_OTHER): Payer: Self-pay

## 2021-11-20 MED ORDER — CLENPIQ 10-3.5-12 MG-GM -GM/175ML PO SOLN
ORAL | 0 refills | Status: AC
  Filled 2021-11-20: qty 350, 1d supply, fill #0

## 2021-11-21 ENCOUNTER — Other Ambulatory Visit (HOSPITAL_BASED_OUTPATIENT_CLINIC_OR_DEPARTMENT_OTHER): Payer: Self-pay

## 2021-11-22 ENCOUNTER — Other Ambulatory Visit (HOSPITAL_BASED_OUTPATIENT_CLINIC_OR_DEPARTMENT_OTHER): Payer: Self-pay

## 2021-11-23 ENCOUNTER — Other Ambulatory Visit (HOSPITAL_BASED_OUTPATIENT_CLINIC_OR_DEPARTMENT_OTHER): Payer: Self-pay

## 2021-12-03 DIAGNOSIS — Z1211 Encounter for screening for malignant neoplasm of colon: Secondary | ICD-10-CM | POA: Diagnosis not present

## 2021-12-03 DIAGNOSIS — K648 Other hemorrhoids: Secondary | ICD-10-CM | POA: Diagnosis not present

## 2021-12-03 DIAGNOSIS — K5000 Crohn's disease of small intestine without complications: Secondary | ICD-10-CM | POA: Diagnosis not present

## 2021-12-03 DIAGNOSIS — K633 Ulcer of intestine: Secondary | ICD-10-CM | POA: Diagnosis not present

## 2021-12-03 DIAGNOSIS — R634 Abnormal weight loss: Secondary | ICD-10-CM | POA: Diagnosis not present

## 2021-12-03 DIAGNOSIS — K5730 Diverticulosis of large intestine without perforation or abscess without bleeding: Secondary | ICD-10-CM | POA: Diagnosis not present

## 2021-12-14 ENCOUNTER — Other Ambulatory Visit (HOSPITAL_BASED_OUTPATIENT_CLINIC_OR_DEPARTMENT_OTHER): Payer: Self-pay

## 2022-03-04 ENCOUNTER — Other Ambulatory Visit (HOSPITAL_BASED_OUTPATIENT_CLINIC_OR_DEPARTMENT_OTHER): Payer: Self-pay

## 2022-03-04 DIAGNOSIS — I10 Essential (primary) hypertension: Secondary | ICD-10-CM | POA: Diagnosis not present

## 2022-03-04 DIAGNOSIS — E782 Mixed hyperlipidemia: Secondary | ICD-10-CM | POA: Diagnosis not present

## 2022-03-04 DIAGNOSIS — E559 Vitamin D deficiency, unspecified: Secondary | ICD-10-CM | POA: Diagnosis not present

## 2022-03-04 DIAGNOSIS — E039 Hypothyroidism, unspecified: Secondary | ICD-10-CM | POA: Diagnosis not present

## 2022-03-06 ENCOUNTER — Other Ambulatory Visit (HOSPITAL_BASED_OUTPATIENT_CLINIC_OR_DEPARTMENT_OTHER): Payer: Self-pay

## 2022-03-06 MED ORDER — LEVOTHYROXINE SODIUM 50 MCG PO TABS
ORAL_TABLET | ORAL | 1 refills | Status: AC
  Filled 2022-03-06: qty 144, 90d supply, fill #0
  Filled 2022-05-27: qty 144, 90d supply, fill #1

## 2022-03-06 MED ORDER — ROSUVASTATIN CALCIUM 20 MG PO TABS
20.0000 mg | ORAL_TABLET | Freq: Every day | ORAL | 1 refills | Status: AC
  Filled 2022-03-06: qty 90, 90d supply, fill #0
  Filled 2022-05-27: qty 90, 90d supply, fill #1

## 2022-03-07 ENCOUNTER — Other Ambulatory Visit (HOSPITAL_BASED_OUTPATIENT_CLINIC_OR_DEPARTMENT_OTHER): Payer: Self-pay

## 2022-03-07 MED ORDER — FLUARIX QUADRIVALENT 0.5 ML IM SUSY
PREFILLED_SYRINGE | INTRAMUSCULAR | 0 refills | Status: AC
  Filled 2022-03-07: qty 0.5, 1d supply, fill #0

## 2022-03-11 ENCOUNTER — Other Ambulatory Visit (HOSPITAL_BASED_OUTPATIENT_CLINIC_OR_DEPARTMENT_OTHER): Payer: Self-pay

## 2022-04-16 DIAGNOSIS — Z1231 Encounter for screening mammogram for malignant neoplasm of breast: Secondary | ICD-10-CM | POA: Diagnosis not present

## 2022-04-17 ENCOUNTER — Other Ambulatory Visit (HOSPITAL_BASED_OUTPATIENT_CLINIC_OR_DEPARTMENT_OTHER): Payer: Self-pay

## 2022-04-17 DIAGNOSIS — L821 Other seborrheic keratosis: Secondary | ICD-10-CM | POA: Diagnosis not present

## 2022-04-17 DIAGNOSIS — L570 Actinic keratosis: Secondary | ICD-10-CM | POA: Diagnosis not present

## 2022-04-17 DIAGNOSIS — Z85828 Personal history of other malignant neoplasm of skin: Secondary | ICD-10-CM | POA: Diagnosis not present

## 2022-04-17 MED ORDER — FLUOROURACIL 5 % EX CREA
1.0000 "application " | TOPICAL_CREAM | Freq: Every evening | CUTANEOUS | 0 refills | Status: AC
  Filled 2022-04-17: qty 40, 15d supply, fill #0

## 2022-04-22 ENCOUNTER — Other Ambulatory Visit (HOSPITAL_BASED_OUTPATIENT_CLINIC_OR_DEPARTMENT_OTHER): Payer: Self-pay

## 2022-04-24 ENCOUNTER — Other Ambulatory Visit (HOSPITAL_BASED_OUTPATIENT_CLINIC_OR_DEPARTMENT_OTHER): Payer: Self-pay

## 2022-04-30 DIAGNOSIS — H903 Sensorineural hearing loss, bilateral: Secondary | ICD-10-CM | POA: Diagnosis not present

## 2022-05-06 ENCOUNTER — Telehealth: Payer: 59 | Admitting: Physician Assistant

## 2022-05-06 DIAGNOSIS — S20469A Insect bite (nonvenomous) of unspecified back wall of thorax, initial encounter: Secondary | ICD-10-CM | POA: Diagnosis not present

## 2022-05-06 DIAGNOSIS — W57XXXA Bitten or stung by nonvenomous insect and other nonvenomous arthropods, initial encounter: Secondary | ICD-10-CM

## 2022-05-06 MED ORDER — DOXYCYCLINE HYCLATE 100 MG PO TABS
200.0000 mg | ORAL_TABLET | Freq: Once | ORAL | 0 refills | Status: AC
  Filled 2022-05-06: qty 2, 1d supply, fill #0

## 2022-05-06 NOTE — Patient Instructions (Signed)
Bradley Ferris, thank you for joining Mar Daring, PA-C for today's virtual visit.  While this provider is not your primary care provider (PCP), if your PCP is located in our provider database this encounter information will be shared with them immediately following your visit.   [place] account gives you access to today's visit and all your visits, tests, and labs performed at [facility] " click here if you don't have a [place] account or go to [email or web address]  Consent: (Patient) Ashley Burke provided verbal consent for this virtual visit at the beginning of the encounter.  Current Medications:  Current Outpatient Medications:    doxycycline (VIBRA-TABS) 100 MG tablet, Take 2 tablets (200 mg total) by mouth once for 1 dose., Disp: 2 tablet, Rfl: 0   acetaminophen (TYLENOL) 500 MG tablet, Take 500-1,000 mg by mouth every 6 (six) hours as needed (for pain/headache.)., Disp: , Rfl:    benzonatate (TESSALON PERLES) 100 MG capsule, Take 1 capsule (100 mg total) by mouth 3 (three) times daily as needed for cough., Disp: 20 capsule, Rfl: 0   budesonide (RHINOCORT AQUA) 32 MCG/ACT nasal spray, Place 1-2 sprays into both nostrils daily as needed (for seasonal allergies.). , Disp: , Rfl:    Coenzyme Q10 (COQ10 PO), Take 1,000 mg by mouth every evening. With Crestor, Disp: , Rfl:    conjugated estrogens (PREMARIN) vaginal cream, Insert 0.5 grams vaginally twice a week for 30 days, Disp: 30 g, Rfl: 7   COVID-19 At Home Antigen Test (CARESTART COVID-19 HOME TEST) KIT, Use as directed within package instructions, Disp: 4 each, Rfl: 0   docusate sodium (COLACE) 100 MG capsule, Take 100 mg by mouth 2 (two) times daily., Disp: , Rfl:    ESTRACE VAGINAL 0.1 MG/GM vaginal cream, Place 1 applicator vaginally as directed. Uses 2-3 times a week, Disp: , Rfl: 11   fluorouracil (EFUDEX) 5 % cream, Apply as directed to skin (affected area) every evening  for 14 days, Disp: 40 g, Rfl: 0   fluorouracil (EFUDEX) 5 % cream, Apply 1 application to affected areas  topically every evening., Disp: 40 g, Rfl: 0   fluticasone (FLONASE) 50 MCG/ACT nasal spray, PLACE 1 SPRAY IN EACH NOSTRIL ONCE A DAY, Disp: 16 g, Rfl: 5   fluticasone (FLONASE) 50 MCG/ACT nasal spray, INSTILL 2 SPRAYS IN EACH NOSTRIL ONCE DAILY, Disp: 16 g, Rfl: 11   fluticasone (FLONASE) 50 MCG/ACT nasal spray, Place 2 sprays into each nostril once daily, Disp: 16 g, Rfl: 5   ibuprofen (ADVIL,MOTRIN) 200 MG tablet, Take 200-400 mg by mouth every 8 (eight) hours as needed (for pain/headaches.). , Disp: , Rfl:    influenza vac split quadrivalent PF (FLUARIX QUADRIVALENT) 0.5 ML injection, Inject into the muscle., Disp: 0.5 mL, Rfl: 0   influenza vac split quadrivalent PF (FLUARIX) 0.5 ML injection, Inject into the muscle., Disp: 0.5 mL, Rfl: 0   levothyroxine (SYNTHROID) 50 MCG tablet, TAKE 2 TABLETS BY MOUTH 4 DAYS OUT OF THE WEEK, ALL OTHER DAYS 1 TABLET DAILY, Disp: 144 tablet, Rfl: 1   levothyroxine (SYNTHROID) 50 MCG tablet, Take by mouth as directed  2 tabs  4 days out of the week, all other days 1 tablet daily, Disp: 144 tablet, Rfl: 1   levothyroxine (SYNTHROID) 50 MCG tablet, Take 2 tablets by mouth daily 4 days out of the week and 1 tablet daily on all other days as directed, Disp: 144 tablet, Rfl: 1  levothyroxine (SYNTHROID, LEVOTHROID) 50 MCG tablet, Take 50 mcg by mouth daily before breakfast. , Disp: , Rfl: 1   Multiple Vitamins-Minerals (MULTIVITAMIN WITH MINERALS) tablet, Take 1 tablet by mouth every morning. , Disp: , Rfl:    ondansetron (ZOFRAN) 4 MG tablet, Take 1 tablet (4 mg total) by mouth every 8 (eight) hours as needed for nausea or vomiting., Disp: 20 tablet, Rfl: 0   oxyCODONE-acetaminophen (ROXICET) 5-325 MG tablet, Take 1-2 tablets by mouth every 4 (four) hours as needed for severe pain., Disp: 40 tablet, Rfl: 0   polyethylene glycol (MIRALAX / GLYCOLAX) packet, Take  17 g by mouth 3 (three) times daily., Disp: , Rfl:    predniSONE (STERAPRED UNI-PAK 21 TAB) 10 MG (21) TBPK tablet, As directed x 6 days, Disp: 21 tablet, Rfl: 0   Pseudoephedrine-Ibuprofen (ADVIL COLD/SINUS) 30-200 MG TABS, Take 1 tablet by mouth daily as needed (for sinus issue)., Disp: , Rfl:    rosuvastatin (CRESTOR) 20 MG tablet, Take 20 mg by mouth every evening. With CoQ10, Disp: , Rfl:    rosuvastatin (CRESTOR) 20 MG tablet, TAKE 1 TABLET BY MOUTH ONCE DAILY, Disp: 90 tablet, Rfl: 1   rosuvastatin (CRESTOR) 20 MG tablet, Take 1 tablet by mouth once a day, Disp: 90 tablet, Rfl: 1   rosuvastatin (CRESTOR) 20 MG tablet, Take 1 tablet (20 mg total) by mouth daily., Disp: 90 tablet, Rfl: 1   Sod Picosulfate-Mag Ox-Cit Acd (CLENPIQ) 10-3.5-12 MG-GM -GM/175ML SOLN, Take 175 mL by mouth twice as directed by physician and not instructions on packaging. Do not refrigerate, Disp: 350 mL, Rfl: 0   sulfamethoxazole-trimethoprim (BACTRIM DS) 800-160 MG tablet, Take 1 tablet twice a day by mouth for 3 days., Disp: 6 tablet, Rfl: 0   Medications ordered in this encounter:  Meds ordered this encounter  Medications   doxycycline (VIBRA-TABS) 100 MG tablet    Sig: Take 2 tablets (200 mg total) by mouth once for 1 dose.    Dispense:  2 tablet    Refill:  0    Order Specific Question:   Supervising Provider    Answer:   Chase Picket [id number]     *If you need refills on other medications prior to your next appointment, please contact your pharmacy*  Follow-Up: Call back or seek an in-person evaluation if the symptoms worsen or if the condition fails to improve as anticipated.  [place] [phone]  Other Instructions  Tick Bite Information, Adult  Ticks are insects that draw blood for food. They climb onto people and animals that brush against the leaves and grasses that they live in. They then bite and attach to the skin. Most ticks are harmless, but some ticks may carry  germs that can cause disease. These germs are spread to a person through a bite. To lower your risk of getting a disease from a tick bite, make sure you: Take steps to prevent tick bites. Check for ticks after being outdoors where ticks live. Watch for symptoms of disease if a tick attached to you or if you think a tick bit you. How can I prevent tick bites? Take these steps to help prevent tick bites when you go outdoors in an area where ticks live: Before you go outdoors: Wear long sleeves and long pants to protect your skin from ticks. Wear light-colored clothing so you can see ticks easier. Tuck your pant legs into your socks. Apply insect repellent that has DEET (20% or higher),  picaridin, or IR3535 in it to the following areas: Any bare skin. Avoid areas around the eyes and mouth. Edges of clothing, like the top of your boots, the bottom of your pant legs, and your sleeve cuffs. Consider applying an insect repellant that contains permethrin. Follow the instructions on the label. Do not apply permethrin directly to the skin. Instead, apply to the following areas: Clothing and shoes. Outdoor gear and tents. When you are outdoors: Avoid walking through areas with long grass. If you are walking on a trail, stay in the middle of the trail so your skin, hair, and clothing do not touch the bushes. Check for ticks on your clothing, hair, and skin often while you are outdoors. Check again before you go inside. When you go indoors: Check your clothing for ticks. Tumble dry clothes in a dryer on high heat for at least 10 minutes. If clothes are damp, additional time may be needed. If clothes require washing, use hot water. Check your gear and pets. Shower soon after being outdoors. Check your body for ticks. Do a full body check using a mirror. Be sure to check your scalp, neck, armpits, waist, groin, and joint areas. These are the spots where ticks attach themselves most often. What is the best  way to remove a tick?  Remove the tick as soon as possible. Removing it can prevent germs from passing to your body. Do not remove the tick with your bare fingers. Do not try to remove a tick with heat, alcohol, petroleum jelly, or fingernail polish. These things can cause the tick to salivate and regurgitate into your bloodstream, increasing your risk of getting a disease. To remove a tick that is crawling on your skin: Go outside and brush the tick off. Use tape or a lint roller. To remove a tick that is attached to your skin: Wash your hands. If you have gloves, put them on. Use a fine-tipped tweezer, curved forceps, or a tick-removal tool to gently grasp the tick as close to your skin and the tick's head as possible. Gently pull with a steady, upward, and even pressure until the tick lets go. While removing the tick: Take care to keep the tick's head attached to its body. Do not twist or jerk the tick. This can make the tick's head or mouth parts break off and stay in your skin. If this happens, try to remove the mouth parts with tweezers. If you cannot remove them, leave the area alone and let the skin heal. Do not squeeze or crush the tick's body. This could force disease-carrying fluids from the tick into your body. What should I do after removing a tick? Clean the bite area and your hands with soap and water, rubbing alcohol, or an iodine scrub. If an antiseptic cream or ointment is available, put a small amount on the bite area. Wash and disinfect any tools that you used to remove the tick. How should I dispose of a tick? To dispose of a live tick, use one of these methods: Place it in rubbing alcohol. Place it in a sealed bag or container, and throw it away. Wrap it tightly in tape, and throw it away. Flush it down the toilet. Where to find more information Centers for Disease Control and Prevention: RankHunter.fr U.S. Environmental Protection Agency:  PlayDentist.is Contact a health care provider if: You have symptoms of a disease after a tick bite. Symptoms of a tick-borne disease can occur from moments after the tick bites  to 30 days after a tick is removed. Symptoms include: Fever or chills. A red rash that makes a circle (bull's-eye rash) in the bite area. Redness and swelling in the bite area. Headache or stiff neck. Muscle, joint, or bone pain. Abnormal tiredness. Numbness in your legs or trouble walking or moving your legs. Tender or swollen lymph glands. Abdominal pain, vomiting, diarrhea, or weight loss. Get help right away if: You are not able to remove a tick. You have muscle weakness or paralysis. Your symptoms get worse or you experience new symptoms. You find an engorged tick on your skin and you are in an area where there is a higher risk of disease from ticks. Summary Ticks may carry germs that can spread to a person through a bite. These germs can cause disease. Wear protective clothing and use insect repellent to prevent tick bites. Follow the instructions on the label. If you find a tick on your body, remove it as soon as possible. If the tick is attached, do not try to remove it with heat, alcohol, petroleum jelly, or fingernail polish. If you have symptoms of a disease after being bitten by a tick, contact a health care provider. This information is not intended to replace advice given to you by your health care provider. Make sure you discuss any questions you have with your health care provider. Document Revised: 07/30/2021 Document Reviewed: 07/30/2021 Elsevier Patient Education  [place].    If you have been instructed to have an in-person evaluation today at a local Urgent Care facility, please use the link below. It will take you to a list of all of our available [place] [facility], including address, phone number and hours of operation. Please do not delay care.  [place]  [facility]  If you or a family member do not have a primary care provider, use the link below to schedule a visit and establish care. When you choose a [facility] primary care physician or advanced practice provider, you gain a long-term partner in health. Find a Primary Care Provider  Learn more about [facility] in-office and virtual care options: [facility]

## 2022-05-06 NOTE — Progress Notes (Signed)
Virtual Visit Consent   Ashley Burke, you are scheduled for a virtual visit with a [facility] provider today. Just as with appointments in the office, your consent must be obtained to participate. Your consent will be active for this visit and any virtual visit you may have with one of our providers in the next 365 days. If you have a MyChart account, a copy of this consent can be sent to you electronically.  As this is a virtual visit, video technology does not allow for your provider to perform a traditional examination. This may limit your provider's ability to fully assess your condition. If your provider identifies any concerns that need to be evaluated in person or the need to arrange testing (such as labs, EKG, etc.), we will make arrangements to do so. Although advances in technology are sophisticated, we cannot ensure that it will always work on either your end or our end. If the connection with a video visit is poor, the visit may have to be switched to a telephone visit. With either a video or telephone visit, we are not always able to ensure that we have a secure connection.  By engaging in this virtual visit, you consent to the provision of healthcare and authorize for your insurance to be billed (if applicable) for the services provided during this visit. Depending on your insurance coverage, you may receive a charge related to this service.  I need to obtain your verbal consent now. Are you willing to proceed with your visit today? Ashley Burke has provided verbal consent on 05/06/2022 for a virtual visit (video or telephone). Mar Daring, PA-C  Date: 05/06/2022 6:38 PM  Virtual Visit via Video Note   I, Mar Daring, connected with  Ashley Burke  ([id number], Nov 04, 1960) on 05/06/22 at  6:30 PM EST by a video-enabled telemedicine application and verified that I am speaking with the correct person using two identifiers.  Location: Patient: Virtual  Visit Location Patient: Home Provider: Virtual Visit Location Provider: Home Office   I discussed the limitations of evaluation and management by telemedicine and the availability of in person appointments. The patient expressed understanding and agreed to proceed.    History of Present Illness: Ashley Burke is a 61 y.o. who identifies as a female who was assigned female at birth, and is being seen today for tick bite. Reports area is red and about the size of a pencil eraser. Tick was found an hour or hour and a half ago. It is a small tick nymph. Does appear to be a deer tick. Tick was found on her back around bra-line. Could be around 24 hr attachment.    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   CHRONIC RHINITIS 02/10/2008   SINUSITIS, RECURRENT 02/10/2008   ALLERGY 02/10/2008    Allergies:  Allergies  Allergen Reactions   Amlodipine Besylate Other (See Comments)    dizziness   Medications:  Current Outpatient Medications:    doxycycline (VIBRA-TABS) 100 MG tablet, Take 2 tablets (200 mg total) by mouth once for 1 dose., Disp: 2 tablet, Rfl: 0   acetaminophen (TYLENOL) 500 MG tablet, Take 500-1,000 mg by  mouth every 6 (six) hours as needed (for pain/headache.)., Disp: , Rfl:    benzonatate (TESSALON PERLES) 100 MG capsule, Take 1 capsule (100 mg total) by mouth 3 (three) times daily as needed for cough., Disp: 20 capsule, Rfl: 0   budesonide (RHINOCORT AQUA) 32 MCG/ACT nasal spray, Place 1-2 sprays into both nostrils daily as needed (for seasonal allergies.). , Disp: , Rfl:    Coenzyme Q10 (COQ10 PO), Take 1,000 mg by mouth every evening. With  Crestor, Disp: , Rfl:    conjugated estrogens (PREMARIN) vaginal cream, Insert 0.5 grams vaginally twice a week for 30 days, Disp: 30 g, Rfl: 7   COVID-19 At Home Antigen Test (CARESTART COVID-19 HOME TEST) KIT, Use as directed within package instructions, Disp: 4 each, Rfl: 0   docusate sodium (COLACE) 100 MG capsule, Take 100 mg by mouth 2 (two) times daily., Disp: , Rfl:    ESTRACE VAGINAL 0.1 MG/GM vaginal cream, Place 1 applicator vaginally as directed. Uses 2-3 times a week, Disp: , Rfl: 11   fluorouracil (EFUDEX) 5 % cream, Apply as directed to skin (affected area) every evening for 14 days, Disp: 40 g, Rfl: 0   fluorouracil (EFUDEX) 5 % cream, Apply 1 application to affected areas  topically every evening., Disp: 40 g, Rfl: 0   fluticasone (FLONASE) 50 MCG/ACT nasal spray, PLACE 1 SPRAY IN EACH NOSTRIL ONCE A DAY, Disp: 16 g, Rfl: 5   fluticasone (FLONASE) 50 MCG/ACT nasal spray, INSTILL 2 SPRAYS IN EACH NOSTRIL ONCE DAILY, Disp: 16 g, Rfl: 11   fluticasone (FLONASE) 50 MCG/ACT nasal spray, Place 2 sprays into each nostril once daily, Disp: 16 g, Rfl: 5   ibuprofen (ADVIL,MOTRIN) 200 MG tablet, Take 200-400 mg by mouth every 8 (eight) hours as needed (for pain/headaches.). , Disp: , Rfl:    influenza vac split quadrivalent PF (FLUARIX QUADRIVALENT) 0.5 ML injection, Inject into the muscle., Disp: 0.5 mL, Rfl: 0   influenza vac split quadrivalent PF (FLUARIX) 0.5 ML injection, Inject into the muscle., Disp: 0.5 mL, Rfl: 0   levothyroxine (SYNTHROID) 50 MCG tablet, TAKE 2 TABLETS BY MOUTH 4 DAYS OUT OF THE WEEK, ALL OTHER DAYS 1 TABLET DAILY, Disp: 144 tablet, Rfl: 1   levothyroxine (SYNTHROID) 50 MCG tablet, Take by mouth as directed  2 tabs  4 days out of the week, all other days 1 tablet daily, Disp: 144 tablet, Rfl: 1   levothyroxine (SYNTHROID) 50 MCG tablet, Take 2 tablets by mouth daily 4 days out of the week and 1 tablet daily on all other days as directed, Disp: 144 tablet, Rfl: 1    levothyroxine (SYNTHROID, LEVOTHROID) 50 MCG tablet, Take 50 mcg by mouth daily before breakfast. , Disp: , Rfl: 1   Multiple Vitamins-Minerals (MULTIVITAMIN WITH MINERALS) tablet, Take 1 tablet by mouth every morning. , Disp: , Rfl:    ondansetron (ZOFRAN) 4 MG tablet, Take 1 tablet (4 mg total) by mouth every 8 (eight) hours as needed for nausea or vomiting., Disp: 20 tablet, Rfl: 0   oxyCODONE-acetaminophen (ROXICET) 5-325 MG tablet, Take 1-2 tablets by mouth every 4 (four) hours as needed for severe pain., Disp: 40 tablet, Rfl: 0   polyethylene glycol (MIRALAX / GLYCOLAX) packet, Take 17 g by mouth 3 (three) times daily., Disp: , Rfl:    predniSONE (STERAPRED UNI-PAK 21 TAB) 10 MG (21) TBPK tablet, As directed x 6 days, Disp: 21 tablet, Rfl: 0   Pseudoephedrine-Ibuprofen (ADVIL COLD/SINUS) 30-200 MG TABS, Take 1  tablet by mouth daily as needed (for sinus issue)., Disp: , Rfl:    rosuvastatin (CRESTOR) 20 MG tablet, Take 20 mg by mouth every evening. With CoQ10, Disp: , Rfl:    rosuvastatin (CRESTOR) 20 MG tablet, TAKE 1 TABLET BY MOUTH ONCE DAILY, Disp: 90 tablet, Rfl: 1   rosuvastatin (CRESTOR) 20 MG tablet, Take 1 tablet by mouth once a day, Disp: 90 tablet, Rfl: 1   rosuvastatin (CRESTOR) 20 MG tablet, Take 1 tablet (20 mg total) by mouth daily., Disp: 90 tablet, Rfl: 1   Sod Picosulfate-Mag Ox-Cit Acd (CLENPIQ) 10-3.5-12 MG-GM -GM/175ML SOLN, Take 175 mL by mouth twice as directed by physician and not instructions on packaging. Do not refrigerate, Disp: 350 mL, Rfl: 0   sulfamethoxazole-trimethoprim (BACTRIM DS) 800-160 MG tablet, Take 1 tablet twice a day by mouth for 3 days., Disp: 6 tablet, Rfl: 0  Observations/Objective: Patient is well-developed, well-nourished in no acute distress.  Resting comfortably at home.  Head is normocephalic, atraumatic.  No labored breathing.  Speech is clear and coherent with logical content.  Patient is alert and oriented at baseline.    Assessment  and Plan: 1. Tick bite of back wall of thorax, unspecified location, initial encounter - doxycycline (VIBRA-TABS) 100 MG tablet; Take 2 tablets (200 mg total) by mouth once for 1 dose.  Dispense: 2 tablet; Refill: 0  - Unknown duration of attachment but could be at longest close to 24 hours - Doxycycline 278m once for prophylaxis prescribed - Can apply topical hydrocortisone for itching - Can take an antihistamine like benadryl, claritin, zyrtec, etc for itching and irritation - Clean with soap and water daily - Monitor for any fevers, flu-like symptoms, pruritic rash, or bulls eye rash - Seek follow up or further evaluation if any symptoms develop or if symptoms worsen  Follow Up Instructions: I discussed the assessment and treatment plan with the patient. The patient was provided an opportunity to ask questions and all were answered. The patient agreed with the plan and demonstrated an understanding of the instructions.  A copy of instructions were sent to the patient via MyChart unless otherwise noted below.    The patient was advised to call back or seek an in-person evaluation if the symptoms worsen or if the condition fails to improve as anticipated.  Time:  I spent 12 minutes with the patient via telehealth technology discussing the above problems/concerns.    JMar Daring PA-C

## 2022-05-07 ENCOUNTER — Other Ambulatory Visit (HOSPITAL_BASED_OUTPATIENT_CLINIC_OR_DEPARTMENT_OTHER): Payer: Self-pay

## 2022-05-08 DIAGNOSIS — H35342 Macular cyst, hole, or pseudohole, left eye: Secondary | ICD-10-CM | POA: Diagnosis not present

## 2022-05-08 DIAGNOSIS — H35373 Puckering of macula, bilateral: Secondary | ICD-10-CM | POA: Diagnosis not present

## 2022-05-08 DIAGNOSIS — D3132 Benign neoplasm of left choroid: Secondary | ICD-10-CM | POA: Diagnosis not present

## 2022-05-08 DIAGNOSIS — H43393 Other vitreous opacities, bilateral: Secondary | ICD-10-CM | POA: Diagnosis not present

## 2022-05-31 ENCOUNTER — Other Ambulatory Visit (HOSPITAL_COMMUNITY): Payer: Self-pay

## 2022-06-03 ENCOUNTER — Other Ambulatory Visit (HOSPITAL_BASED_OUTPATIENT_CLINIC_OR_DEPARTMENT_OTHER): Payer: Self-pay

## 2022-06-14 ENCOUNTER — Other Ambulatory Visit (HOSPITAL_BASED_OUTPATIENT_CLINIC_OR_DEPARTMENT_OTHER): Payer: Self-pay

## 2022-07-23 ENCOUNTER — Other Ambulatory Visit (HOSPITAL_COMMUNITY): Payer: Self-pay

## 2022-08-01 DIAGNOSIS — L821 Other seborrheic keratosis: Secondary | ICD-10-CM | POA: Diagnosis not present

## 2022-08-01 DIAGNOSIS — L570 Actinic keratosis: Secondary | ICD-10-CM | POA: Diagnosis not present

## 2022-08-01 DIAGNOSIS — Z85828 Personal history of other malignant neoplasm of skin: Secondary | ICD-10-CM | POA: Diagnosis not present

## 2022-08-01 DIAGNOSIS — D485 Neoplasm of uncertain behavior of skin: Secondary | ICD-10-CM | POA: Diagnosis not present

## 2022-08-01 DIAGNOSIS — B078 Other viral warts: Secondary | ICD-10-CM | POA: Diagnosis not present

## 2022-08-27 DIAGNOSIS — Z0000 Encounter for general adult medical examination without abnormal findings: Secondary | ICD-10-CM | POA: Diagnosis not present

## 2022-08-27 DIAGNOSIS — E559 Vitamin D deficiency, unspecified: Secondary | ICD-10-CM | POA: Diagnosis not present

## 2022-08-27 DIAGNOSIS — E782 Mixed hyperlipidemia: Secondary | ICD-10-CM | POA: Diagnosis not present

## 2022-08-27 DIAGNOSIS — E039 Hypothyroidism, unspecified: Secondary | ICD-10-CM | POA: Diagnosis not present

## 2022-09-03 DIAGNOSIS — H5712 Ocular pain, left eye: Secondary | ICD-10-CM | POA: Diagnosis not present

## 2022-09-03 DIAGNOSIS — H10212 Acute toxic conjunctivitis, left eye: Secondary | ICD-10-CM | POA: Diagnosis not present

## 2022-09-24 DIAGNOSIS — E039 Hypothyroidism, unspecified: Secondary | ICD-10-CM | POA: Diagnosis not present

## 2022-09-24 DIAGNOSIS — E559 Vitamin D deficiency, unspecified: Secondary | ICD-10-CM | POA: Diagnosis not present

## 2022-09-24 DIAGNOSIS — E782 Mixed hyperlipidemia: Secondary | ICD-10-CM | POA: Diagnosis not present

## 2022-09-24 DIAGNOSIS — Z0000 Encounter for general adult medical examination without abnormal findings: Secondary | ICD-10-CM | POA: Diagnosis not present

## 2022-09-26 ENCOUNTER — Other Ambulatory Visit (HOSPITAL_BASED_OUTPATIENT_CLINIC_OR_DEPARTMENT_OTHER): Payer: Self-pay

## 2022-09-26 MED ORDER — ROSUVASTATIN CALCIUM 20 MG PO TABS
20.0000 mg | ORAL_TABLET | Freq: Every day | ORAL | 3 refills | Status: AC
  Filled 2022-09-26: qty 90, 90d supply, fill #0
  Filled 2023-02-23 – 2023-07-17 (×2): qty 90, 90d supply, fill #1

## 2022-09-26 MED ORDER — LEVOTHYROXINE SODIUM 50 MCG PO TABS
ORAL_TABLET | ORAL | 0 refills | Status: AC
  Filled 2022-09-26: qty 144, 90d supply, fill #0

## 2022-11-29 ENCOUNTER — Other Ambulatory Visit (HOSPITAL_COMMUNITY): Payer: Self-pay

## 2022-12-12 ENCOUNTER — Other Ambulatory Visit (HOSPITAL_BASED_OUTPATIENT_CLINIC_OR_DEPARTMENT_OTHER): Payer: Self-pay

## 2022-12-12 MED ORDER — NAPROXEN 500 MG PO TABS
500.0000 mg | ORAL_TABLET | Freq: Two times a day (BID) | ORAL | 2 refills | Status: AC | PRN
  Filled 2022-12-12: qty 60, 30d supply, fill #0
  Filled 2023-02-23: qty 60, 30d supply, fill #1
  Filled 2023-04-11: qty 60, 30d supply, fill #2

## 2023-01-14 ENCOUNTER — Other Ambulatory Visit (HOSPITAL_BASED_OUTPATIENT_CLINIC_OR_DEPARTMENT_OTHER): Payer: Self-pay

## 2023-01-14 MED ORDER — ROSUVASTATIN CALCIUM 20 MG PO TABS
20.0000 mg | ORAL_TABLET | Freq: Every day | ORAL | 0 refills | Status: AC
  Filled 2023-01-14: qty 90, 90d supply, fill #0

## 2023-01-14 MED ORDER — FLUTICASONE PROPIONATE 50 MCG/ACT NA SUSP
2.0000 | Freq: Every day | NASAL | 3 refills | Status: AC
  Filled 2023-01-14: qty 16, 30d supply, fill #0

## 2023-01-14 MED ORDER — LEVOTHYROXINE SODIUM 50 MCG PO TABS
ORAL_TABLET | ORAL | 0 refills | Status: DC
  Filled 2023-01-14: qty 144, 90d supply, fill #0

## 2023-01-21 ENCOUNTER — Other Ambulatory Visit (HOSPITAL_BASED_OUTPATIENT_CLINIC_OR_DEPARTMENT_OTHER): Payer: Self-pay

## 2023-02-23 ENCOUNTER — Other Ambulatory Visit (HOSPITAL_BASED_OUTPATIENT_CLINIC_OR_DEPARTMENT_OTHER): Payer: Self-pay

## 2023-02-24 ENCOUNTER — Other Ambulatory Visit: Payer: Self-pay

## 2023-02-24 ENCOUNTER — Other Ambulatory Visit (HOSPITAL_BASED_OUTPATIENT_CLINIC_OR_DEPARTMENT_OTHER): Payer: Self-pay

## 2023-02-25 ENCOUNTER — Other Ambulatory Visit (HOSPITAL_BASED_OUTPATIENT_CLINIC_OR_DEPARTMENT_OTHER): Payer: Self-pay

## 2023-02-25 MED ORDER — LEVOTHYROXINE SODIUM 50 MCG PO TABS
100.0000 ug | ORAL_TABLET | Freq: Every day | ORAL | 1 refills | Status: DC
  Filled 2023-02-25 – 2023-04-11 (×2): qty 144, 90d supply, fill #0
  Filled 2023-07-17: qty 144, 90d supply, fill #1

## 2023-03-03 ENCOUNTER — Other Ambulatory Visit (HOSPITAL_BASED_OUTPATIENT_CLINIC_OR_DEPARTMENT_OTHER): Payer: Self-pay

## 2023-03-03 MED ORDER — INFLUENZA VIRUS VACC SPLIT PF (FLUZONE) 0.5 ML IM SUSY
0.5000 mL | PREFILLED_SYRINGE | Freq: Once | INTRAMUSCULAR | 0 refills | Status: AC
  Filled 2023-03-03: qty 0.5, 1d supply, fill #0

## 2023-03-20 ENCOUNTER — Other Ambulatory Visit: Payer: Self-pay | Admitting: Obstetrics and Gynecology

## 2023-03-20 ENCOUNTER — Other Ambulatory Visit (HOSPITAL_BASED_OUTPATIENT_CLINIC_OR_DEPARTMENT_OTHER): Payer: Self-pay

## 2023-03-20 DIAGNOSIS — E2839 Other primary ovarian failure: Secondary | ICD-10-CM

## 2023-03-20 MED ORDER — ESTRADIOL 10 MCG VA TABS
1.0000 | ORAL_TABLET | VAGINAL | 7 refills | Status: DC
  Filled 2023-03-20: qty 24, 42d supply, fill #0
  Filled 2023-05-02 (×2): qty 24, 42d supply, fill #1
  Filled 2023-07-17: qty 24, 42d supply, fill #2

## 2023-04-11 ENCOUNTER — Other Ambulatory Visit: Payer: Self-pay

## 2023-04-11 ENCOUNTER — Other Ambulatory Visit (HOSPITAL_BASED_OUTPATIENT_CLINIC_OR_DEPARTMENT_OTHER): Payer: Self-pay

## 2023-04-17 ENCOUNTER — Other Ambulatory Visit (HOSPITAL_BASED_OUTPATIENT_CLINIC_OR_DEPARTMENT_OTHER): Payer: Self-pay

## 2023-04-17 ENCOUNTER — Other Ambulatory Visit (HOSPITAL_COMMUNITY): Payer: Self-pay

## 2023-04-23 ENCOUNTER — Other Ambulatory Visit (HOSPITAL_BASED_OUTPATIENT_CLINIC_OR_DEPARTMENT_OTHER): Payer: Self-pay

## 2023-04-23 ENCOUNTER — Telehealth: Payer: Managed Care, Other (non HMO) | Admitting: Physician Assistant

## 2023-04-23 DIAGNOSIS — J069 Acute upper respiratory infection, unspecified: Secondary | ICD-10-CM

## 2023-04-23 MED ORDER — FLUCONAZOLE 150 MG PO TABS
150.0000 mg | ORAL_TABLET | Freq: Once | ORAL | 0 refills | Status: AC
  Filled 2023-04-23: qty 2, 2d supply, fill #0

## 2023-04-23 MED ORDER — AZITHROMYCIN 250 MG PO TABS
500.0000 mg | ORAL_TABLET | Freq: Every day | ORAL | 0 refills | Status: AC
  Filled 2023-04-23: qty 5, 5d supply, fill #0
  Filled 2023-04-23: qty 10, 5d supply, fill #0

## 2023-04-23 NOTE — Progress Notes (Signed)
Message sent to patient requesting further input regarding current symptoms. Awaiting patient response.  

## 2023-04-30 ENCOUNTER — Other Ambulatory Visit (HOSPITAL_BASED_OUTPATIENT_CLINIC_OR_DEPARTMENT_OTHER): Payer: Self-pay

## 2023-04-30 MED ORDER — AMOXICILLIN-POT CLAVULANATE 875-125 MG PO TABS
1.0000 | ORAL_TABLET | Freq: Two times a day (BID) | ORAL | 0 refills | Status: AC
  Filled 2023-04-30: qty 20, 10d supply, fill #0

## 2023-05-02 ENCOUNTER — Other Ambulatory Visit (HOSPITAL_BASED_OUTPATIENT_CLINIC_OR_DEPARTMENT_OTHER): Payer: Self-pay

## 2023-05-02 ENCOUNTER — Other Ambulatory Visit: Payer: Self-pay

## 2023-05-12 ENCOUNTER — Other Ambulatory Visit (HOSPITAL_BASED_OUTPATIENT_CLINIC_OR_DEPARTMENT_OTHER): Payer: Self-pay

## 2023-07-17 ENCOUNTER — Other Ambulatory Visit (HOSPITAL_BASED_OUTPATIENT_CLINIC_OR_DEPARTMENT_OTHER): Payer: Self-pay

## 2023-09-08 ENCOUNTER — Other Ambulatory Visit (HOSPITAL_BASED_OUTPATIENT_CLINIC_OR_DEPARTMENT_OTHER): Payer: Self-pay

## 2023-09-08 MED ORDER — LEVOTHYROXINE SODIUM 50 MCG PO TABS
ORAL_TABLET | ORAL | 0 refills | Status: AC
  Filled 2024-02-02: qty 144, 90d supply, fill #0

## 2023-09-08 MED ORDER — ROSUVASTATIN CALCIUM 20 MG PO TABS
20.0000 mg | ORAL_TABLET | Freq: Every day | ORAL | 3 refills | Status: AC
  Filled 2023-09-08 – 2023-09-29 (×2): qty 90, 90d supply, fill #0
  Filled 2024-02-02: qty 90, 90d supply, fill #1

## 2023-09-08 MED ORDER — LEVOTHYROXINE SODIUM 50 MCG PO TABS
ORAL_TABLET | ORAL | 1 refills | Status: AC
  Filled 2023-09-08: qty 144, 90d supply, fill #0
  Filled 2023-09-08: qty 142, 90d supply, fill #0
  Filled 2023-09-29: qty 144, 90d supply, fill #0

## 2023-09-29 ENCOUNTER — Other Ambulatory Visit (HOSPITAL_BASED_OUTPATIENT_CLINIC_OR_DEPARTMENT_OTHER): Payer: Self-pay

## 2023-10-07 ENCOUNTER — Other Ambulatory Visit (HOSPITAL_BASED_OUTPATIENT_CLINIC_OR_DEPARTMENT_OTHER): Payer: Self-pay

## 2024-02-02 ENCOUNTER — Other Ambulatory Visit (HOSPITAL_BASED_OUTPATIENT_CLINIC_OR_DEPARTMENT_OTHER): Payer: Self-pay

## 2024-02-05 ENCOUNTER — Other Ambulatory Visit (HOSPITAL_BASED_OUTPATIENT_CLINIC_OR_DEPARTMENT_OTHER): Payer: Self-pay

## 2024-02-05 MED ORDER — ESTRADIOL 10 MCG VA TABS
ORAL_TABLET | VAGINAL | 2 refills | Status: AC
  Filled 2024-02-05: qty 8, 28d supply, fill #0

## 2024-02-06 ENCOUNTER — Other Ambulatory Visit (HOSPITAL_BASED_OUTPATIENT_CLINIC_OR_DEPARTMENT_OTHER): Payer: Self-pay

## 2024-02-06 MED ORDER — ESTRADIOL 0.1 MG/GM VA CREA
1.0000 g | TOPICAL_CREAM | VAGINAL | 2 refills | Status: AC
  Filled 2024-02-06: qty 42.5, 90d supply, fill #0

## 2024-03-01 ENCOUNTER — Other Ambulatory Visit (HOSPITAL_BASED_OUTPATIENT_CLINIC_OR_DEPARTMENT_OTHER): Payer: Self-pay

## 2024-03-01 MED ORDER — FLUZONE 0.5 ML IM SUSY
0.5000 mL | PREFILLED_SYRINGE | Freq: Once | INTRAMUSCULAR | 0 refills | Status: AC
  Filled 2024-03-01: qty 0.5, 1d supply, fill #0

## 2024-03-08 ENCOUNTER — Other Ambulatory Visit (HOSPITAL_BASED_OUTPATIENT_CLINIC_OR_DEPARTMENT_OTHER): Payer: Self-pay

## 2024-04-20 ENCOUNTER — Other Ambulatory Visit (HOSPITAL_BASED_OUTPATIENT_CLINIC_OR_DEPARTMENT_OTHER): Payer: Self-pay

## 2024-04-23 ENCOUNTER — Other Ambulatory Visit (HOSPITAL_BASED_OUTPATIENT_CLINIC_OR_DEPARTMENT_OTHER): Payer: Self-pay

## 2024-04-24 ENCOUNTER — Other Ambulatory Visit (HOSPITAL_COMMUNITY): Payer: Self-pay

## 2024-04-24 MED ORDER — LIDOCAINE VISCOUS HCL 2 % MT SOLN
OROMUCOSAL | 0 refills | Status: AC
  Filled 2024-04-24: qty 100, 5d supply, fill #0

## 2024-04-24 MED ORDER — PSEUDOEPH-BROMPHEN-DM 30-2-10 MG/5ML PO SYRP
10.0000 mL | ORAL_SOLUTION | Freq: Four times a day (QID) | ORAL | 0 refills | Status: AC
  Filled 2024-04-24: qty 120, 3d supply, fill #0

## 2024-04-24 MED ORDER — BENZONATATE 100 MG PO CAPS
100.0000 mg | ORAL_CAPSULE | Freq: Three times a day (TID) | ORAL | 0 refills | Status: AC
  Filled 2024-04-24: qty 21, 7d supply, fill #0

## 2024-04-28 ENCOUNTER — Other Ambulatory Visit (HOSPITAL_COMMUNITY): Payer: Self-pay

## 2024-04-28 ENCOUNTER — Other Ambulatory Visit (HOSPITAL_BASED_OUTPATIENT_CLINIC_OR_DEPARTMENT_OTHER): Payer: Self-pay

## 2024-04-28 MED ORDER — PROMETHAZINE-DM 6.25-15 MG/5ML PO SYRP
5.0000 mL | ORAL_SOLUTION | Freq: Every evening | ORAL | 0 refills | Status: AC
  Filled 2024-04-28 (×3): qty 120, 24d supply, fill #0

## 2024-04-28 MED ORDER — DOXYCYCLINE HYCLATE 100 MG PO CAPS
100.0000 mg | ORAL_CAPSULE | Freq: Two times a day (BID) | ORAL | 0 refills | Status: AC
  Filled 2024-04-28 (×3): qty 10, 5d supply, fill #0

## 2024-04-30 ENCOUNTER — Other Ambulatory Visit (HOSPITAL_COMMUNITY): Payer: Self-pay

## 2024-04-30 MED ORDER — AZITHROMYCIN 250 MG PO TABS
ORAL_TABLET | ORAL | 0 refills | Status: AC
  Filled 2024-04-30: qty 6, 5d supply, fill #0

## 2024-05-20 ENCOUNTER — Other Ambulatory Visit (HOSPITAL_COMMUNITY): Payer: Self-pay
